# Patient Record
Sex: Female | Born: 1970 | Race: White | Hispanic: No | Marital: Single | Smoking: Never smoker
Health system: Southern US, Community
[De-identification: ages and names within clinical notes are randomized; demographics above are authoritative.]

## PROBLEM LIST (undated history)

## (undated) ENCOUNTER — Inpatient Hospital Stay (HOSPITAL_COMMUNITY): Payer: Self-pay

## (undated) DIAGNOSIS — Z9889 Other specified postprocedural states: Secondary | ICD-10-CM

## (undated) DIAGNOSIS — F419 Anxiety disorder, unspecified: Secondary | ICD-10-CM

## (undated) DIAGNOSIS — R7303 Prediabetes: Secondary | ICD-10-CM

## (undated) DIAGNOSIS — L309 Dermatitis, unspecified: Secondary | ICD-10-CM

## (undated) DIAGNOSIS — R112 Nausea with vomiting, unspecified: Secondary | ICD-10-CM

## (undated) DIAGNOSIS — T7840XA Allergy, unspecified, initial encounter: Secondary | ICD-10-CM

## (undated) DIAGNOSIS — E039 Hypothyroidism, unspecified: Secondary | ICD-10-CM

## (undated) HISTORY — PX: DILATION AND CURETTAGE OF UTERUS: SHX78

## (undated) HISTORY — DX: Anxiety disorder, unspecified: F41.9

## (undated) HISTORY — DX: Dermatitis, unspecified: L30.9

## (undated) HISTORY — PX: KNEE CARTILAGE SURGERY: SHX688

## (undated) HISTORY — PX: WISDOM TOOTH EXTRACTION: SHX21

## (undated) HISTORY — DX: Allergy, unspecified, initial encounter: T78.40XA

---

## 2011-03-22 HISTORY — PX: LAPAROSCOPY: SHX197

## 2016-01-05 ENCOUNTER — Encounter: Payer: Self-pay | Admitting: Pediatrics

## 2016-01-05 ENCOUNTER — Ambulatory Visit (INDEPENDENT_AMBULATORY_CARE_PROVIDER_SITE_OTHER): Payer: BLUE CROSS/BLUE SHIELD | Admitting: Pediatrics

## 2016-01-05 VITALS — BP 122/80 | HR 82 | Temp 98.0°F | Resp 18 | Ht 63.0 in | Wt 188.6 lb

## 2016-01-05 DIAGNOSIS — J0100 Acute maxillary sinusitis, unspecified: Secondary | ICD-10-CM

## 2016-01-05 DIAGNOSIS — J4530 Mild persistent asthma, uncomplicated: Secondary | ICD-10-CM | POA: Insufficient documentation

## 2016-01-05 DIAGNOSIS — J3089 Other allergic rhinitis: Secondary | ICD-10-CM | POA: Diagnosis not present

## 2016-01-05 MED ORDER — FLUTICASONE PROPIONATE 50 MCG/ACT NA SUSP: 2.0000 | Freq: Every day | NASAL | 5 refills | Status: DC

## 2016-01-05 MED ORDER — ALBUTEROL SULFATE HFA 108 (90 BASE) MCG/ACT IN AERS: 2.0000 | INHALATION_SPRAY | RESPIRATORY_TRACT | 3 refills | Status: DC | PRN

## 2016-01-05 MED ORDER — CETIRIZINE HCL 10 MG PO TABS: 10.0000 mg | ORAL_TABLET | Freq: Every day | ORAL | 5 refills | Status: DC

## 2016-01-05 MED ORDER — MONTELUKAST SODIUM 10 MG PO TABS: 10.0000 mg | ORAL_TABLET | Freq: Every day | ORAL | 5 refills | Status: DC

## 2016-01-05 MED ORDER — AMOXICILLIN-POT CLAVULANATE 875-125 MG PO TABS: 1.0000 | ORAL_TABLET | Freq: Two times a day (BID) | ORAL | 0 refills | Status: DC

## 2016-01-05 NOTE — Patient Instructions (Addendum)
Environmental control of dust and mold Zyrtec 10 mg once a day for runny nose Fluticasone 2 sprays per nostril once a day for stuffy nose Proair -2 puffs every 4 hours if needed for wheezing or coughing spells Montelukast  10 mg once a day for coughing or wheezing Add prednisone 20 mg twice a day for 3 days, 20 mg on the fourth day, 10 mg on the fifth day Augmentin 875 mg-take 1 tablet every 12 hours for 10 days for infection She  should have a flu vaccination Call me if you are not doing better on this treatment

## 2016-01-05 NOTE — Progress Notes (Signed)
[place] [id number] Dept: [phone]  New Patient Note  Patient ID: Tamara Bender, female    DOB: Feb 10, 1971  Age: 45 y.o. MRN: [id number] Date of Office Visit: 01/05/2016 Referring provider: Lilian Coma, MD [place] [id number] [place], [place] [address]    Chief Complaint: New Patient (Initial Visit) (for the last month pt has felt like her throat is closing. She has some coughing. )  HPI Tamara Bender presents for evaluation of nasal congestion and coughing spells for about 3 weeks. She has had nasal congestion since she moved from [place] into this area. She had a sinus infection in the end of September treated with Augmentin. She is bringing up a discolored mucus. Her symptoms are worse at night. She has a history of eczema as a child. She has aggravation of her nasal congestion on  exposure to dust and cigarette smoke. She does not have a history of asthma or hives. She continues to breast-feed.  Review of Systems  Constitutional: Negative.   HENT:       Nasal congestion off and on for about 5 months  Eyes: Negative.   Respiratory:       Coughing spells for 3 weeks  Cardiovascular: Negative.   Gastrointestinal: Negative.   Genitourinary:       History of PCOS  Musculoskeletal: Negative.   Skin:       History of eczema as a child  Neurological: Negative.   Endo/Heme/Allergies:       Pre-diabetes. Hypothyroidism since age 33 complicated by hair loss  Psychiatric/Behavioral: Negative.     Outpatient Encounter Prescriptions as of 01/05/2016  Medication Sig  . levothyroxine (SYNTHROID) 75 MCG tablet Take 75 mcg by mouth daily.  . Prenatal Vit-Fe Fumarate-FA (PRENATAL MULTIVITAMIN) TABS tablet Take 1 tablet by mouth daily at 12 noon.  Marland Kitchen albuterol (PROAIR HFA) 108 (90 Base) MCG/ACT inhaler Inhale 2 puffs into the lungs every 4 (four) hours as needed for wheezing or shortness of breath.  Marland Kitchen amoxicillin-clavulanate (AUGMENTIN) 875-125 MG tablet Take  1 tablet by mouth 2 (two) times daily.  . cetirizine (ZYRTEC) 10 MG tablet Take 1 tablet (10 mg total) by mouth daily.  . fluticasone (FLONASE) 50 MCG/ACT nasal spray Place 2 sprays into both nostrils daily.  . montelukast (SINGULAIR) 10 MG tablet Take 1 tablet (10 mg total) by mouth at bedtime.   No facility-administered encounter medications on file as of 01/05/2016.      Drug Allergies:  No Known Allergies  Family History: [name]'s family history is not on file..Family history is negative for hayfever, asthma, sinus problems eczema, hives, food allergies, chronic bronchitis or emphysema.  Social and environmental. She has a dog at home. She is not exposed to cigarette smoking. She has never smoked cigarettes.  Physical Exam: BP 122/80   Pulse 82   Temp 98 F (36.7 C) (Oral)   Resp 18   Ht 5\' 3"  (1.6 m)   Wt 188 lb 9.6 oz (85.5 kg)   SpO2 95%   BMI 33.41 kg/m    Physical Exam  Constitutional: She is oriented to person, place, and time. She appears well-developed and well-nourished.  HENT:  Eyes normal. Ears normal. Nose mild swelling of nasal turbinates. Pharynx normal except for a yellow postnasal drainage  Neck: Neck supple. No thyromegaly present.  Cardiovascular:  S1 and S2 normal no murmurs  Pulmonary/Chest:  Clear to percussion and auscultation  Abdominal: Soft. There is no tenderness (no hepatosplenomegaly).  Lymphadenopathy:    She has no cervical adenopathy.  Neurological: She is alert and oriented to person, place, and time.  Skin:  Clear but some hair loss noted in her scalp  Psychiatric: She has a normal mood and affect. Her behavior is normal. Judgment and thought content normal.  Vitals reviewed.   Diagnostics: FVC 2.15 L FEV1 1.89 L. Predicted FVC 3.55 L predicted FEV1 2.88 L. After albuterol 2 puffs FVC 2.26 L FEV1 2.12 L-this shows a moderate reduction in the forced vital capacity. The FEV1 did improve 12%  Allergy skin tests were positive to some  molds on intradermal testing only   Assessment  Assessment and Plan: 1. Mild persistent asthma without complication   2. Other allergic rhinitis   3. Acute non-recurrent maxillary sinusitis     Meds ordered this encounter  Medications  . cetirizine (ZYRTEC) 10 MG tablet    Sig: Take 1 tablet (10 mg total) by mouth daily.    Dispense:  30 tablet    Refill:  5  . fluticasone (FLONASE) 50 MCG/ACT nasal spray    Sig: Place 2 sprays into both nostrils daily.    Dispense:  16 g    Refill:  5  . amoxicillin-clavulanate (AUGMENTIN) 875-125 MG tablet    Sig: Take 1 tablet by mouth 2 (two) times daily.    Dispense:  20 tablet    Refill:  0  . albuterol (PROAIR HFA) 108 (90 Base) MCG/ACT inhaler    Sig: Inhale 2 puffs into the lungs every 4 (four) hours as needed for wheezing or shortness of breath.    Dispense:  1 Inhaler    Refill:  3  . montelukast (SINGULAIR) 10 MG tablet    Sig: Take 1 tablet (10 mg total) by mouth at bedtime.    Dispense:  30 tablet    Refill:  5    Patient Instructions  Environmental control of dust and mold Zyrtec 10 mg once a day for runny nose Fluticasone 2 sprays per nostril once a day for stuffy nose Proair -2 puffs every 4 hours if needed for wheezing or coughing spells Montelukast  10 mg once a day for coughing or wheezing Add prednisone 20 mg twice a day for 3 days, 20 mg on the fourth day, 10 mg on the fifth day Augmentin 875 mg-take 1 tablet every 12 hours for 10 days for infection She  should have a flu vaccination Call me if you are not doing better on this treatment    Return in about 4 weeks (around 02/02/2016).   Thank you for the opportunity to care for this patient.  Please do not hesitate to contact me with questions.  Penne Lash, M.D.  [facility] [address] [place], [place] [address] [phone]

## 2016-02-08 ENCOUNTER — Ambulatory Visit: Payer: BLUE CROSS/BLUE SHIELD | Admitting: Pediatrics

## 2016-02-09 ENCOUNTER — Ambulatory Visit: Payer: BLUE CROSS/BLUE SHIELD | Admitting: Pediatrics

## 2016-04-21 ENCOUNTER — Other Ambulatory Visit: Payer: Self-pay | Admitting: Sports Medicine

## 2016-04-21 ENCOUNTER — Other Ambulatory Visit: Payer: Self-pay

## 2016-04-21 DIAGNOSIS — M25562 Pain in left knee: Secondary | ICD-10-CM

## 2016-04-22 ENCOUNTER — Ambulatory Visit
Admission: RE | Admit: 2016-04-22 | Discharge: 2016-04-22 | Disposition: A | Discharge status: Home / Self Care | Payer: BLUE CROSS/BLUE SHIELD | Source: Ambulatory Visit | Attending: Sports Medicine | Admitting: Sports Medicine

## 2016-04-22 DIAGNOSIS — M25562 Pain in left knee: Secondary | ICD-10-CM

## 2017-02-28 ENCOUNTER — Telehealth: Payer: Self-pay | Admitting: Genetics

## 2017-02-28 NOTE — Telephone Encounter (Signed)
Ms. 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  Her paternal grandmother had breast cancer twice in her 39's.  Ms. Landgrebe scheduled an appointment 1/9 at 9:00 am to discuss genetic testing further.  She reports she had the [facility] panel done in 2015- she will fill out a release form so we can try to obtain these results to determine if she needs more genetic testing or not.

## 2017-03-24 ENCOUNTER — Telehealth: Payer: Self-pay | Admitting: Genetics

## 2017-03-24 NOTE — Telephone Encounter (Signed)
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  However, looking in other databases the some other labs are calling it benign.  I recommended her siblings still have genetic testing for the MUTYH mutation and given the paternal family history of breast cancer.  She reports that they said they would make appointments.  She gave permission to discuss her results and the family history she shared with me with her siblings Dona Walby and Luanna Cole.  I will send her a copy of the test results we obtained in the mail to her- verified her address.

## 2017-03-29 ENCOUNTER — Other Ambulatory Visit: Payer: BLUE CROSS/BLUE SHIELD

## 2017-03-29 ENCOUNTER — Encounter: Payer: BLUE CROSS/BLUE SHIELD | Admitting: Genetics

## 2017-04-15 ENCOUNTER — Inpatient Hospital Stay (HOSPITAL_COMMUNITY)
Admission: AD | Admit: 2017-04-15 | Discharge: 2017-04-16 | Disposition: A | Discharge status: Home / Self Care | Payer: BLUE CROSS/BLUE SHIELD | Source: Ambulatory Visit | Attending: Obstetrics & Gynecology | Admitting: Obstetrics & Gynecology

## 2017-04-15 ENCOUNTER — Inpatient Hospital Stay (HOSPITAL_COMMUNITY): Payer: BLUE CROSS/BLUE SHIELD

## 2017-04-15 ENCOUNTER — Encounter (HOSPITAL_COMMUNITY): Payer: Self-pay | Admitting: *Deleted

## 2017-04-15 DIAGNOSIS — O020 Blighted ovum and nonhydatidiform mole: Secondary | ICD-10-CM | POA: Insufficient documentation

## 2017-04-15 DIAGNOSIS — O209 Hemorrhage in early pregnancy, unspecified: Secondary | ICD-10-CM

## 2017-04-15 DIAGNOSIS — Z3A08 8 weeks gestation of pregnancy: Secondary | ICD-10-CM | POA: Diagnosis not present

## 2017-04-15 HISTORY — DX: Hypothyroidism, unspecified: E03.9

## 2017-04-15 LAB — CBC
HCT: 36.2 % (ref 36.0–46.0)
Hemoglobin: 12 g/dL (ref 12.0–15.0)
MCH: 28 pg (ref 26.0–34.0)
MCHC: 33.1 g/dL (ref 30.0–36.0)
MCV: 84.6 fL (ref 78.0–100.0)
PLATELETS: 326 10*3/uL (ref 150–400)
RBC: 4.28 MIL/uL (ref 3.87–5.11)
RDW: 12.8 % (ref 11.5–15.5)
WBC: 19.3 10*3/uL — AB (ref 4.0–10.5)

## 2017-04-15 MED ORDER — LACTATED RINGERS IV SOLN: INTRAVENOUS | Status: DC

## 2017-04-15 NOTE — MAU Provider Note (Signed)
History     CSN: [id number]  Arrival date and time: 04/15/17 2026   First Provider Initiated Contact with Patient 04/15/17 2116      Chief Complaint  Patient presents with  . Vaginal Bleeding   HPI Tamara Bender 47 y.o. [redacted]w[redacted]d  Client had IVF in December at [place] out on Apr 04, 2017 that she had a blighted ovum.  Stopped her meds and progesterone and was expecting a miscarriage.  Had light vaginal bleeding for one week but today began having cramping and much heavier bleeding.  Was using a cervical cup with the bleeding and it kept having to be changed.  She had so much bleeding, she finally sat in the bathtub.  She had a hand sized clot at home.  And then started being dizzy while sitting down.  Called her doctor at [facility] who advised calling EMS.  Her diastolic BP was in the 85'I when EMS arrived so she came to MAU.  At this point, the bleeding is less although still coming.  BP on arrival 121/63.  OB History    Gravida Para Term Preterm AB Living   8 2 2   5 2    SAB TAB Ectopic Multiple Live Births   5       2      Past Medical History:  Diagnosis Date  . Eczema   . Hypothyroid     Past Surgical History:  Procedure Laterality Date  . CESAREAN SECTION    . KNEE CARTILAGE SURGERY    . LAPAROSCOPY      Family History  Problem Relation Age of Onset  . Allergic rhinitis Neg Hx   . Angioedema Neg Hx   . Asthma Neg Hx   . Eczema Neg Hx   . Urticaria Neg Hx   . Immunodeficiency Neg Hx     Social History   Tobacco Use  . Smoking status: Never Smoker  . Smokeless tobacco: Never Used  Substance Use Topics  . Alcohol use: Yes  . Drug use: No    Allergies: No Known Allergies  Medications Prior to Admission  Medication Sig Dispense Refill Last Dose  . albuterol (PROAIR HFA) 108 (90 Base) MCG/ACT inhaler Inhale 2 puffs into the lungs every 4 (four) hours as needed for wheezing or shortness of breath. 1 Inhaler 3   . amoxicillin-clavulanate (AUGMENTIN)  875-125 MG tablet Take 1 tablet by mouth 2 (two) times daily. 20 tablet 0   . cetirizine (ZYRTEC) 10 MG tablet Take 1 tablet (10 mg total) by mouth daily. 30 tablet 5   . fluticasone (FLONASE) 50 MCG/ACT nasal spray Place 2 sprays into both nostrils daily. 16 g 5   . levothyroxine (SYNTHROID) 75 MCG tablet Take 75 mcg by mouth daily.   Taking  . montelukast (SINGULAIR) 10 MG tablet Take 1 tablet (10 mg total) by mouth at bedtime. 30 tablet 5   . Prenatal Vit-Fe Fumarate-FA (PRENATAL MULTIVITAMIN) TABS tablet Take 1 tablet by mouth daily at 12 noon.   Taking    Review of Systems  Constitutional: Negative for fever.  Eyes: Negative for visual disturbance.  Gastrointestinal: Positive for abdominal pain. Negative for diarrhea, nausea and vomiting.  Genitourinary: Positive for vaginal bleeding.  Neurological: Positive for dizziness. Negative for headaches.   Physical Exam   Blood pressure 121/63, pulse 78, temperature 98.2 F (36.8 C), resp. rate 18, height 5\' 4"  (1.626 m), weight 192 lb (87.1 kg), last menstrual period 02/15/2017.  Physical Exam  Nursing note and vitals reviewed. Constitutional: She is oriented to person, place, and time. She appears well-developed and well-nourished.  HENT:  Head: Normocephalic.  Eyes: EOM are normal.  Neck: Neck supple.  GI: Soft. There is no tenderness. There is no rebound and no guarding.  Genitourinary:  Genitourinary Comments: Speculum exam: Vagina - Several clots removed - half dollar sized.  Cervix - one clot in cervix, teased out with kelly clamp, then had small amoutn of dark blood oozing. Bimanual exam: deferred. Chaperone present for exam.   Musculoskeletal: Normal range of motion.  Neurological: She is alert and oriented to person, place, and time.  Skin: Skin is warm and dry.  Psychiatric: She has a normal mood and affect.    MAU Course  Procedures CLINICAL DATA:  Severe vaginal bleeding. Current assigned gestational age of [redacted]  weeks 3 days by IVF.  EXAM: OBSTETRIC <14 WK [place] AND TRANSVAGINAL OB US  TECHNIQUE: Both transabdominal and transvaginal ultrasound examinations were performed for complete evaluation of the gestation as well as the maternal uterus, adnexal regions, and pelvic cul-de-sac. Transvaginal technique was performed to assess early pregnancy.  COMPARISON:  None.  FINDINGS: Intrauterine gestational sac: Single; irregular sac shape  Yolk sac:  Not Visualized.  Embryo:  Not Visualized.  MSD: 14 mm   6 w   2 d  Subchorionic hemorrhage:  None visualized.  Maternal uterus/adnexae: Both ovaries are normal appearance. No adnexal mass or abnormal free fluid identified.  IMPRESSION: Findings are suspicious but not yet definitive for failed pregnancy. Recommend follow-up US in 10-14 days for definitive diagnosis. This recommendation follows SRU consensus guidelines: Diagnostic Criteria for Nonviable Pregnancy Early in the First Trimester. [place] Med 2013; 948:5462-70.   Results for orders placed or performed during the hospital encounter of 04/15/17 (from the past 24 hour(s))  CBC     Status: Abnormal   Collection Time: 04/15/17  9:12 PM  Result Value Ref Range   WBC 19.3 (H) 4.0 - 10.5 K/uL   RBC 4.28 3.87 - 5.11 MIL/uL   Hemoglobin 12.0 12.0 - 15.0 g/dL   HCT 36.2 36.0 - 46.0 %   MCV 84.6 78.0 - 100.0 fL   MCH 28.0 26.0 - 34.0 pg   MCHC 33.1 30.0 - 36.0 g/dL   RDW 12.8 11.5 - 15.5 %   Platelets 326 150 - 400 K/uL  hCG, quantitative, pregnancy     Status: Abnormal   Collection Time: 04/15/17  9:12 PM  Result Value Ref Range   hCG, Beta Chain, Quant, S 18,769 (H) <5 mIU/mL  ABO/Rh     Status: None (Preliminary result)   Collection Time: 04/15/17  9:12 PM  Result Value Ref Range   ABO/RH(D) O POS     MDM Client has had IVF previously.  States her blood type is O positive.  Client very sensitive to the medical charges.  Her insurance required a $7000 out of pocket  per year.  Does not want extra tests done.    Consult with Dr. Harolyn Rutherford.  Will give IVF as orthostatic/tachcardiac on standing and will draw ABO Rh and quant to have on record.  Can be scheduled as OR is available on Monday if she wants D&E.  HGB is stable despite the heavy bleeding she had earlier today and Ultrasound today shows sac is still present in uterus.  Care assumed by Robyne Askew, NP at 2325.  IVF infusing.    O Positive blood type  confirmed  Discussed options for management of incomplete AB including expectant management, Cytotec or D&C. Prefers expectant management at this time. Verbalizes understanding that intervention may become necessary if SAB is not completed spontaneously or if heavy bleeding or infection occur.   Cytotec, percocet, & zofran given to patient prior to discharge.      Early Intrauterine Pregnancy Failure Protocol X  Documented intrauterine pregnancy failure less than or equal to [redacted] weeks gestation  X  No serious current illness  X  Baseline Hgb greater than or equal to 10g/dl  X  Patient has easy accessible transportation to the hospital  X  Clear preference  X  Practitioner/physician deems patient reliable  X  Counseling by practitioner or physician  X  Patient education by RN  X  Consent form signed       Rho-Gam given by RN if indicated  X  Medication dispensed  X  Cytotec 600 mcg buccally by RN in MAU       Intravaginally by NP in MAU       Rectally by patient at home       Rectally by RN in MAU   X   Percocet 1 tab Q4 hrs prn, #15 X   Zofran 4 mg ODT Q 8 hrs prn  Reviewed with pt cytotec procedure.  Pt verbalizes that she lives close to the hospital and has transportation readily available.  Pt appears reliable and verbalizes understanding and agrees with plan of care  Assessment and Plan  A: 1. Blighted ovum   2. Vaginal bleeding in pregnancy, first trimester    P: Discharge home Discussed reasons to return to MAU Rx percocet & zofran  prn F/u with reproductive endocrinologist   Jorje Guild, NP

## 2017-04-15 NOTE — MAU Note (Signed)
Pt presents via EMS for heavy vaginal bleeding that started today around 2 today. PT states that she had IVF and was told by U/S she had a blighted ovum.

## 2017-04-16 ENCOUNTER — Other Ambulatory Visit: Payer: Self-pay | Admitting: Advanced Practice Midwife

## 2017-04-16 DIAGNOSIS — O020 Blighted ovum and nonhydatidiform mole: Secondary | ICD-10-CM

## 2017-04-16 LAB — HCG, QUANTITATIVE, PREGNANCY: hCG, Beta Chain, Quant, S: 18769 m[IU]/mL — ABNORMAL HIGH (ref <5)

## 2017-04-16 LAB — ABO/RH: ABO/RH(D): O POS

## 2017-04-16 MED ORDER — OXYCODONE-ACETAMINOPHEN 5-325 MG PO TABS: 1.0000 | ORAL_TABLET | ORAL | 0 refills | Status: DC | PRN

## 2017-04-16 MED ORDER — OXYCODONE-ACETAMINOPHEN 5-325 MG PO TABS
1.0000 | ORAL_TABLET | Freq: Once | ORAL | Status: AC
  Administered 2017-04-16: 1 via ORAL
  Filled 2017-04-16: qty 1

## 2017-04-16 MED ORDER — ONDANSETRON 4 MG PO TBDP: 4.0000 mg | ORAL_TABLET | Freq: Three times a day (TID) | ORAL | 0 refills | Status: DC | PRN

## 2017-04-16 MED ORDER — MISOPROSTOL 200 MCG PO TABS: 800.0000 ug | ORAL_TABLET | Freq: Once | ORAL | 0 refills | Status: DC

## 2017-04-16 MED ORDER — ONDANSETRON HCL 4 MG/2ML IJ SOLN
4.0000 mg | Freq: Once | INTRAMUSCULAR | Status: AC
  Administered 2017-04-16: 4 mg via INTRAVENOUS
  Filled 2017-04-16: qty 2

## 2017-04-16 MED ORDER — MISOPROSTOL 200 MCG PO TABS
600.0000 ug | ORAL_TABLET | Freq: Once | ORAL | Status: AC
  Administered 2017-04-16: 600 ug via BUCCAL
  Filled 2017-04-16: qty 3

## 2017-04-16 NOTE — Progress Notes (Addendum)
Patient called MAU, and states that she has not had any bleeding since having the Cytotec here last night. She has had minor cramps and spotting only at this time. She is wondering if she needs have another dose of Cytotec or if she can have D&C. Thoroughly discussed each option with the patient. She reports that in the past she has had D&Cs for a miscarriage, and she is more interested in having a D&C at this time. DW Dr. Kennon Rounds, ok to send a message to [place] to schedule a D&C. Patient will try a second dose of Cytotec tomorrow as well. Cytotec 814mcg buccally x1 sent to pharmacy on file.  Marcille Buffy 8:02 PM 04/16/17

## 2017-04-16 NOTE — Discharge Instructions (Signed)
FACTS YOU SHOULD KNOW  WHAT IS AN EARLY PREGNANCY FAILURE? Once the egg is fertilized with the sperm and begins to develop, it attaches to the lining of the uterus. This early pregnancy tissue may not develop into an embryo (the beginning stage of a baby). Sometimes an embryo does develop but does not continue to grow. These problems can be seen on ultrasound.   MANAGEMNT OF EARLY PREGNANCY FAILURE: About 4 out of 100 (0.25%) women will have a pregnancy loss in her lifetime.  One in five pregnancies is found to be an early pregnancy failure.  There are 3 ways to care for an early pregnancy failure:   (1) Surgery, (2) Medicine, (3) Waiting for you to pass the pregnancy on your own. The decision as to how to proceed after being diagnosed with and early pregnancy failure is an individual one.  The decision can be made only after appropriate counseling.  You need to weigh the pros and cons of the 3 choices. Then you can make the choice that works for you. SURGERY (D&E)  Procedure over in 1 day  Requires being put to sleep  Bleeding may be light  Possible problems during surgery, including injury to womb(uterus)  Care provider has more control Medicine (CYTOTEC)  The complete procedure may take days to weeks  No Surgery  Bleeding may be heavy at times  There may be drug side effects  Patient has more control Waiting  You may choose to wait, in which case your own body may complete the passing of the abnormal early pregnancy on its own in about 2-4 weeks  Your bleeding may be heavy at times  There is a small possibility that you may need surgery if the bleeding is too much or not all of the pregnancy has passed. CYTOTEC MANAGEMENT Prostaglandins (cytotec) are the most widely used drug for this purpose. They cause the uterus to cramp and contract. You will place the medicine yourself inside your vagina in the privacy of your home. Empting of the uterus should occur within 3 days but  the process may continue for several weeks. The bleeding may seem heavy at times. POSSIBLE SIDE EFFECTS FROM CYTOTEC  Nausea   Vomiting  Diarrhea Fever  Chills  Hot Flashes Side effects  from the process of the early pregnancy failure include:  Cramping  Bleeding  Headaches  Dizziness RISKS: This is a low risk procedure. Less than 1 in 100 women has a complication. An incomplete passage of the early pregnancy may occur. Also, Hemorrhage (heavy bleeding) could happen.  Rarely the pregnancy will not be passed completely. Excessively heavy bleeding may occur.  Your doctor may need to perform surgery to empty the uterus (D&E). Afterwards: Everybody will feel differently after the early pregnancy completion. You may have soreness or cramps for a day or two. You may have soreness or cramps for day or two.  You may have light bleeding for up to 2 weeks. You may be as active as you feel like being. If you have any of the following problems you may call Maternity Admissions Unit at [phone].  If you have pain that does not get better  with pain medication  Bleeding that soaks through 2 thick full-sized sanitary pads in an hour for more than 2 consecutive hours  Cramps that last longer than 2 days  Foul smelling discharge  Fever above 100.4 degrees F Even if you do not have any of these symptoms, you should have a  follow-up exam to make sure you are healing properly. This appointment will be made for you before you leave the hospital. Your next normal period will start again in 4-6 week after the loss. You can get pregnant soon after the loss, so use birth control right away. Finally: Make sure all your questions are answered before during and after any procedure. Follow up with medical care and family planning methods.

## 2017-04-17 ENCOUNTER — Encounter (HOSPITAL_COMMUNITY): Payer: Self-pay | Admitting: *Deleted

## 2017-04-17 ENCOUNTER — Other Ambulatory Visit: Payer: Self-pay

## 2017-04-18 ENCOUNTER — Encounter (HOSPITAL_COMMUNITY): Payer: Self-pay | Admitting: Emergency Medicine

## 2017-04-18 ENCOUNTER — Other Ambulatory Visit: Payer: Self-pay

## 2017-04-18 ENCOUNTER — Encounter (HOSPITAL_COMMUNITY)
Admission: RE | Disposition: A | Discharge status: Home / Self Care | Payer: Self-pay | Source: Ambulatory Visit | Attending: Obstetrics and Gynecology

## 2017-04-18 ENCOUNTER — Ambulatory Visit (HOSPITAL_COMMUNITY): Payer: BLUE CROSS/BLUE SHIELD | Admitting: Certified Registered Nurse Anesthetist

## 2017-04-18 ENCOUNTER — Ambulatory Visit (HOSPITAL_COMMUNITY)
Admission: RE | Admit: 2017-04-18 | Discharge: 2017-04-18 | Disposition: A | Discharge status: Home / Self Care | Payer: BLUE CROSS/BLUE SHIELD | Source: Ambulatory Visit | Attending: Obstetrics and Gynecology | Admitting: Obstetrics and Gynecology

## 2017-04-18 DIAGNOSIS — R7303 Prediabetes: Secondary | ICD-10-CM | POA: Insufficient documentation

## 2017-04-18 DIAGNOSIS — Z79899 Other long term (current) drug therapy: Secondary | ICD-10-CM | POA: Insufficient documentation

## 2017-04-18 DIAGNOSIS — E039 Hypothyroidism, unspecified: Secondary | ICD-10-CM | POA: Insufficient documentation

## 2017-04-18 DIAGNOSIS — O021 Missed abortion: Secondary | ICD-10-CM

## 2017-04-18 HISTORY — DX: Prediabetes: R73.03

## 2017-04-18 HISTORY — PX: DILATION AND EVACUATION: SHX1459

## 2017-04-18 HISTORY — DX: Other specified postprocedural states: Z98.890

## 2017-04-18 HISTORY — DX: Other specified postprocedural states: R11.2

## 2017-04-18 LAB — CBC
HEMATOCRIT: 31.4 % — AB (ref 36.0–46.0)
Hemoglobin: 10.3 g/dL — ABNORMAL LOW (ref 12.0–15.0)
MCH: 27.8 pg (ref 26.0–34.0)
MCHC: 32.8 g/dL (ref 30.0–36.0)
MCV: 84.9 fL (ref 78.0–100.0)
PLATELETS: 287 10*3/uL (ref 150–400)
RBC: 3.7 MIL/uL — ABNORMAL LOW (ref 3.87–5.11)
RDW: 13 % (ref 11.5–15.5)
WBC: 7 10*3/uL (ref 4.0–10.5)

## 2017-04-18 LAB — HCG, QUANTITATIVE, PREGNANCY: HCG, BETA CHAIN, QUANT, S: 2941 m[IU]/mL — AB (ref <5)

## 2017-04-18 SURGERY — DILATION AND EVACUATION, UTERUS
Anesthesia: Monitor Anesthesia Care | Site: Vagina

## 2017-04-18 MED ORDER — MIDAZOLAM HCL 2 MG/2ML IJ SOLN
INTRAMUSCULAR | Status: AC
  Filled 2017-04-18: qty 2

## 2017-04-18 MED ORDER — FENTANYL CITRATE (PF) 100 MCG/2ML IJ SOLN: 25.0000 ug | INTRAMUSCULAR | Status: DC | PRN

## 2017-04-18 MED ORDER — LIDOCAINE HCL (CARDIAC) 20 MG/ML IV SOLN
INTRAVENOUS | Status: AC
  Filled 2017-04-18: qty 5

## 2017-04-18 MED ORDER — ONDANSETRON HCL 4 MG/2ML IJ SOLN
INTRAMUSCULAR | Status: DC | PRN
  Administered 2017-04-18: 4 mg via INTRAVENOUS

## 2017-04-18 MED ORDER — DEXAMETHASONE SODIUM PHOSPHATE 10 MG/ML IJ SOLN
INTRAMUSCULAR | Status: DC | PRN
  Administered 2017-04-18: 4 mg via INTRAVENOUS

## 2017-04-18 MED ORDER — METOCLOPRAMIDE HCL 5 MG/ML IJ SOLN: 10.0000 mg | Freq: Once | INTRAMUSCULAR | Status: DC | PRN

## 2017-04-18 MED ORDER — PROPOFOL 10 MG/ML IV BOLUS
INTRAVENOUS | Status: DC | PRN
  Administered 2017-04-18: 30 mg via INTRAVENOUS
  Administered 2017-04-18 (×2): 20 mg via INTRAVENOUS
  Administered 2017-04-18: 30 mg via INTRAVENOUS
  Administered 2017-04-18: 20 mg via INTRAVENOUS
  Administered 2017-04-18: 30 mg via INTRAVENOUS
  Administered 2017-04-18: 20 mg via INTRAVENOUS
  Administered 2017-04-18: 30 mg via INTRAVENOUS
  Administered 2017-04-18 (×2): 20 mg via INTRAVENOUS
  Administered 2017-04-18: 30 mg via INTRAVENOUS
  Administered 2017-04-18: 20 mg via INTRAVENOUS

## 2017-04-18 MED ORDER — HYDROCODONE-ACETAMINOPHEN 7.5-325 MG PO TABS: 1.0000 | ORAL_TABLET | Freq: Once | ORAL | Status: DC | PRN

## 2017-04-18 MED ORDER — CHLOROPROCAINE HCL 1 % IJ SOLN
INTRAMUSCULAR | Status: DC | PRN
  Administered 2017-04-18: 10 mL

## 2017-04-18 MED ORDER — KETOROLAC TROMETHAMINE 30 MG/ML IJ SOLN
INTRAMUSCULAR | Status: DC | PRN
  Administered 2017-04-18: 30 mg via INTRAVENOUS

## 2017-04-18 MED ORDER — SCOPOLAMINE 1 MG/3DAYS TD PT72
MEDICATED_PATCH | TRANSDERMAL | Status: AC
  Administered 2017-04-18: 1.5 mg via TRANSDERMAL
  Filled 2017-04-18: qty 1

## 2017-04-18 MED ORDER — IBUPROFEN 600 MG PO TABS: 600.0000 mg | ORAL_TABLET | Freq: Four times a day (QID) | ORAL | 3 refills | Status: DC | PRN

## 2017-04-18 MED ORDER — KETOROLAC TROMETHAMINE 30 MG/ML IJ SOLN
INTRAMUSCULAR | Status: AC
  Filled 2017-04-18: qty 1

## 2017-04-18 MED ORDER — DEXAMETHASONE SODIUM PHOSPHATE 4 MG/ML IJ SOLN
INTRAMUSCULAR | Status: AC
  Filled 2017-04-18: qty 1

## 2017-04-18 MED ORDER — LACTATED RINGERS IV SOLN
INTRAVENOUS | Status: DC
  Administered 2017-04-18: 13:00:00 via INTRAVENOUS

## 2017-04-18 MED ORDER — MEPERIDINE HCL 25 MG/ML IJ SOLN: 6.2500 mg | INTRAMUSCULAR | Status: DC | PRN

## 2017-04-18 MED ORDER — SCOPOLAMINE 1 MG/3DAYS TD PT72
1.0000 | MEDICATED_PATCH | Freq: Once | TRANSDERMAL | Status: DC
  Administered 2017-04-18: 1.5 mg via TRANSDERMAL

## 2017-04-18 MED ORDER — ONDANSETRON HCL 4 MG/2ML IJ SOLN
INTRAMUSCULAR | Status: AC
  Filled 2017-04-18: qty 2

## 2017-04-18 MED ORDER — FENTANYL CITRATE (PF) 100 MCG/2ML IJ SOLN
INTRAMUSCULAR | Status: DC | PRN
  Administered 2017-04-18 (×4): 25 ug via INTRAVENOUS

## 2017-04-18 MED ORDER — MIDAZOLAM HCL 2 MG/2ML IJ SOLN
INTRAMUSCULAR | Status: DC | PRN
  Administered 2017-04-18: 2 mg via INTRAVENOUS

## 2017-04-18 MED ORDER — FENTANYL CITRATE (PF) 100 MCG/2ML IJ SOLN
INTRAMUSCULAR | Status: AC
  Filled 2017-04-18: qty 2

## 2017-04-18 MED ORDER — CHLOROPROCAINE HCL 1 % IJ SOLN
INTRAMUSCULAR | Status: AC
  Filled 2017-04-18: qty 30

## 2017-04-18 MED ORDER — PROPOFOL 10 MG/ML IV BOLUS
INTRAVENOUS | Status: AC
  Filled 2017-04-18: qty 20

## 2017-04-18 SURGICAL SUPPLY — 19 items
CATH ROBINSON RED A/P 16FR (CATHETERS) ×3 IMPLANT
DECANTER SPIKE VIAL GLASS SM (MISCELLANEOUS) ×3 IMPLANT
GLOVE BIOGEL PI IND STRL 6.5 (GLOVE) ×1 IMPLANT
GLOVE BIOGEL PI IND STRL 7.0 (GLOVE) ×1 IMPLANT
GLOVE BIOGEL PI INDICATOR 6.5 (GLOVE) ×2
GLOVE BIOGEL PI INDICATOR 7.0 (GLOVE) ×2
GLOVE SURG SS PI 6.0 STRL IVOR (GLOVE) ×3 IMPLANT
GOWN STRL REUS W/TWL LRG LVL3 (GOWN DISPOSABLE) ×6 IMPLANT
KIT BERKELEY 1ST TRIMESTER 3/8 (MISCELLANEOUS) ×3 IMPLANT
NS IRRIG 1000ML POUR BTL (IV SOLUTION) ×3 IMPLANT
PACK VAGINAL MINOR WOMEN LF (CUSTOM PROCEDURE TRAY) ×3 IMPLANT
PAD OB MATERNITY 4.3X12.25 (PERSONAL CARE ITEMS) ×3 IMPLANT
PAD PREP 24X48 CUFFED NSTRL (MISCELLANEOUS) ×3 IMPLANT
SET BERKELEY SUCTION TUBING (SUCTIONS) ×3 IMPLANT
TOWEL OR 17X24 6PK STRL BLUE (TOWEL DISPOSABLE) ×6 IMPLANT
VACURETTE 10 RIGID CVD (CANNULA) IMPLANT
VACURETTE 7MM CVD STRL WRAP (CANNULA) ×3 IMPLANT
VACURETTE 8 RIGID CVD (CANNULA) IMPLANT
VACURETTE 9 RIGID CVD (CANNULA) IMPLANT

## 2017-04-18 NOTE — H&P (Signed)
Tamara Bender is an 47 y.o. female [phone] with a missed abortion here for definitive treatment with dilatation and evacuation. Patient was diagnosed with a failed pregnancy on 04/04/2017 with WF (pregnancy conceived via IVF). Patient was seen in [facility]U with cramping pain and vaginal bleeding on 04/15/2017. Ultrasound at that time demonstrated a persistent gestational sac. She opted for medical management with cytotec. On 04/16/2017, patient contacted office and reported no vaginal bleeding since taking cytotec. She elected to be scheduled for a dilatation and evacuation. She also elected to repeat medical treatment with cytotec. Patient reports minimal vaginal bleeding with second course of cytotec and did not experience any cramping   Menstrual History: Patient's last menstrual period was 02/15/2017.    Past Medical History:  Diagnosis Date  . Eczema    as a child, no current problems  . Hypothyroid   . PONV (postoperative nausea and vomiting)   . Pre-diabetes    no meds - diet controlled    Past Surgical History:  Procedure Laterality Date  . CESAREAN SECTION  2012, 2016   x 2  . DILATION AND CURETTAGE OF UTERUS     x 2 - MAB  . KNEE CARTILAGE SURGERY Bilateral 1998, 2018   1998 left, 2018 right  . LAPAROSCOPY  2013   blocked fallopian tube  . WISDOM TOOTH EXTRACTION      Family History  Problem Relation Age of Onset  . Allergic rhinitis Neg Hx   . Angioedema Neg Hx   . Asthma Neg Hx   . Eczema Neg Hx   . Urticaria Neg Hx   . Immunodeficiency Neg Hx     Social History:  reports that  has never smoked. she has never used smokeless tobacco. She reports that she drinks alcohol. She reports that she does not use drugs.  Allergies:  Allergies  Allergen Reactions  . Molds & Smuts Other (See Comments)    Airborne Molds causes SNEEZING    Medications Prior to Admission  Medication Sig Dispense Refill Last Dose  . albuterol (PROAIR HFA) 108 (90 Base) MCG/ACT inhaler Inhale 2  puffs into the lungs every 4 (four) hours as needed for wheezing or shortness of breath. 1 Inhaler 3   . levothyroxine (SYNTHROID, LEVOTHROID) 88 MCG tablet Take 88 mcg by mouth daily.  0 04/18/2017 at Unknown time  . misoprostol (CYTOTEC) 200 MCG tablet Take 4 tablets (800 mcg total) by mouth once for 1 dose. 3 tablet 0 04/17/2017 at Unknown time  . naproxen sodium (ALEVE) 220 MG tablet Take 220 mg by mouth as needed.   04/18/2017 at Unknown time  . ondansetron (ZOFRAN ODT) 4 MG disintegrating tablet Take 1 tablet (4 mg total) by mouth every 8 (eight) hours as needed for nausea or vomiting. 15 tablet 0 04/17/2017 at Unknown time  . oxyCODONE-acetaminophen (PERCOCET/ROXICET) 5-325 MG tablet Take 1 tablet by mouth every 4 (four) hours as needed. (Patient taking differently: Take 1 tablet by mouth every 4 (four) hours as needed for moderate pain. ) 15 tablet 0 04/17/2017 at Unknown time    ROS See pertinent in HPI Blood pressure 137/76, pulse 75, temperature 97.9 F (36.6 C), temperature source Oral, resp. rate 16, height 5\' 4"  (1.626 m), weight 192 lb (87.1 kg), last menstrual period 02/15/2017, SpO2 100 %. Physical Exam GENERAL: Well-developed, well-nourished female in no acute distress.  LUNGS: Clear to auscultation bilaterally.  HEART: Regular rate and rhythm. ABDOMEN: Soft, nontender, nondistended. No organomegaly. PELVIC: Deferred to OR EXTREMITIES:  No cyanosis, clubbing, or edema, 2+ distal pulses.  Results for orders placed or performed during the hospital encounter of 04/18/17 (from the past 24 hour(s))  CBC     Status: Abnormal   Collection Time: 04/18/17 12:08 PM  Result Value Ref Range   WBC 7.0 4.0 - 10.5 K/uL   RBC 3.70 (L) 3.87 - 5.11 MIL/uL   Hemoglobin 10.3 (L) 12.0 - 15.0 g/dL   HCT 31.4 (L) 36.0 - 46.0 %   MCV 84.9 78.0 - 100.0 fL   MCH 27.8 26.0 - 34.0 pg   MCHC 32.8 30.0 - 36.0 g/dL   RDW 13.0 11.5 - 15.5 %   Platelets 287 150 - 400 K/uL    No results  found. 04/15/2017 CLINICAL DATA: Severe vaginal bleeding. Current assigned gestational age of [redacted] weeks 3 days by IVF.  EXAM: OBSTETRIC <14 WK [place] AND TRANSVAGINAL OB US  TECHNIQUE: Both transabdominal and transvaginal ultrasound examinations were performed for complete evaluation of the gestation as well as the maternal uterus, adnexal regions, and pelvic cul-de-sac. Transvaginal technique was performed to assess early pregnancy.  COMPARISON: None.  FINDINGS: Intrauterine gestational sac: Single; irregular sac shape  Yolk sac: Not Visualized.  Embryo: Not Visualized.  MSD: 14 mm 6 w 2 d  Subchorionic hemorrhage: None visualized.  Maternal uterus/adnexae: Both ovaries are normal appearance. No adnexal mass or abnormal free fluid identified.  IMPRESSION: Findings are suspicious but not yet definitive for failed pregnancy. Recommend follow-up US in 10-14 days for definitive diagnosis. This recommendation follows SRU consensus guidelines: Diagnostic Criteria for Nonviable Pregnancy Early in the First Trimester. [employer] Med 2013; 119:4174-08.   Assessment/Plan: 47 yo [id number] with missed abortion here for definitive treatment with dilatation and evacuation - Risks, benefits and alternatives were explained including but not limited to risks of bleeding, infection, uterine perforation and damage to adjacent organs. Patient verbalized understanding and all questions were answered  Tamara Bender 04/18/2017, 12:40 PM

## 2017-04-18 NOTE — Transfer of Care (Signed)
Immediate Anesthesia Transfer of Care Note  Patient: Tamara Bender  Procedure(s) Performed: DILATATION AND EVACUATION (N/A Vagina )  Patient Location: PACU  Anesthesia Type:MAC  Level of Consciousness: awake, alert , oriented and patient cooperative  Airway & Oxygen Therapy: Patient Spontanous Breathing and Patient connected to nasal cannula oxygen  Post-op Assessment: Report given to RN, Post -op Vital signs reviewed and stable and Patient moving all extremities X 4  Post vital signs: Reviewed and stable  Last Vitals:  Vitals:   04/18/17 1210  BP: 137/76  Pulse: 75  Resp: 16  Temp: 36.6 C  SpO2: 100%    Last Pain:  Vitals:   04/18/17 1210  TempSrc: Oral      Patients Stated Pain Goal: 2 (68/15/94 7076)  Complications: No apparent anesthesia complications

## 2017-04-18 NOTE — Anesthesia Preprocedure Evaluation (Signed)
Anesthesia Evaluation  Patient identified by MRN, date of birth, ID band Patient awake    Reviewed: Allergy & Precautions, Patient's Chart, lab work & pertinent test results  History of Anesthesia Complications (+) PONV and history of anesthetic complications  Airway Mallampati: II  TM Distance: >3 FB Neck ROM: Full    Dental no notable dental hx. (+) Teeth Intact   Pulmonary asthma ,    Pulmonary exam normal breath sounds clear to auscultation       Cardiovascular negative cardio ROS Normal cardiovascular exam Rhythm:Regular Rate:Normal     Neuro/Psych negative neurological ROS  negative psych ROS   GI/Hepatic negative GI ROS, Neg liver ROS,   Endo/Other  Hypothyroidism Obesity Pre Diabetes  Renal/GU negative Renal ROS  negative genitourinary   Musculoskeletal negative musculoskeletal ROS (+)   Abdominal (+) + obese,   Peds  Hematology negative hematology ROS (+)   Anesthesia Other Findings   Reproductive/Obstetrics (+) Pregnancy Missed Ab                             Anesthesia Physical Anesthesia Plan  ASA: II  Anesthesia Plan: MAC   Post-op Pain Management:    Induction: Intravenous  PONV Risk Score and Plan:   Airway Management Planned: Natural Airway and Simple Face Mask  Additional Equipment:   Intra-op Plan:   Post-operative Plan:   Informed Consent: I have reviewed the patients History and Physical, chart, labs and discussed the procedure including the risks, benefits and alternatives for the proposed anesthesia with the patient or authorized representative who has indicated his/her understanding and acceptance.   Dental advisory given  Plan Discussed with: CRNA, Anesthesiologist and Surgeon  Anesthesia Plan Comments:         Anesthesia Quick Evaluation

## 2017-04-18 NOTE — Op Note (Signed)
Tamara Bender PROCEDURE DATE: 04/18/2017  PREOPERATIVE DIAGNOSIS: 6 week missed abortion. POSTOPERATIVE DIAGNOSIS: The same. PROCEDURE:     Dilation and Evacuation. SURGEON:  Dr. Mora Bellman  INDICATIONS: 47 y.o. [id number] MAB at [redacted] weeks gestation, needing surgical completion after 2 failed attempts of medical management with cytotec.  Risks of surgery were discussed with the patient including but not limited to: bleeding which may require transfusion; infection which may require antibiotics; injury to uterus or surrounding organs;need for additional procedures including laparotomy or laparoscopy; possibility of intrauterine scarring which may impair future fertility; and other postoperative/anesthesia complications. Written informed consent was obtained.    FINDINGS:  An 8-week size anteverted uterus, moderate amounts of products of conception, specimen sent to pathology.   ANESTHESIA:    Monitored intravenous sedation, paracervical block. INTRAVENOUS FLUIDS:  500 ml of LR ESTIMATED BLOOD LOSS:  Less than 20 ml. SPECIMENS:  Products of conception sent to pathology COMPLICATIONS:  None immediate.  PROCEDURE DETAILS:  The patient was then taken to the operating room where general anesthesia was administered and was found to be adequate.  After an adequate timeout was performed, she was placed in the dorsal lithotomy position and examined; then prepped and draped in the sterile manner.   Her bladder was catheterized for an unmeasured amount of clear, yellow urine. A vaginal speculum was then placed in the patient's vagina and a single tooth tenaculum was applied to the anterior lip of the cervix.  A paracervical block using 0.5% Marcaine was administered. The cervix was gently dilated to accommodate a 7 mm suction curette that was gently advanced to the uterine fundus.  The suction device was then activated and curette slowly rotated to clear the uterus of products of conception.  A sharp  curettage was then performed to confirm complete emptying of the uterus. There was minimal bleeding noted and the tenaculum removed with good hemostasis noted.   All instruments were removed from the patient's vagina. A post procedure bedside ultrasound revealed a thin endometrial stripe. The patient tolerated the procedure well and was taken to the recovery area awake, and in stable condition.  The patient will be discharged to home as per PACU criteria.  Routine postoperative instructions given.  She was prescribed Ibuprofen and Colace.  She will follow up in the office in  2 weeks for postoperative evaluation.

## 2017-04-18 NOTE — Discharge Instructions (Signed)
DISCHARGE INSTRUCTIONS: D&C / D&E The following instructions have been prepared to help you care for yourself upon your return home.   Personal hygiene:  Use sanitary pads for vaginal drainage, not tampons.  Shower the day after your procedure.  NO tub baths, pools or Jacuzzis for 2-3 weeks.  Wipe front to back after using the bathroom.  Activity and limitations:  Do NOT drive or operate any equipment for 24 hours. The effects of anesthesia are still present and drowsiness may result.  Do NOT rest in bed all day.  Walking is encouraged.  Walk up and down stairs slowly.  You may resume your normal activity in one to two days or as indicated by your physician.  Sexual activity: NO intercourse for at least 2 weeks after the procedure, or as indicated by your physician.  Diet: Eat a light meal as desired this evening. You may resume your usual diet tomorrow.  Return to work: You may resume your work activities in one to two days or as indicated by your doctor.  What to expect after your surgery: Expect to have vaginal bleeding/discharge for 2-3 days and spotting for up to 10 days. It is not unusual to have soreness for up to 1-2 weeks. You may have a slight burning sensation when you urinate for the first day. Mild cramps may continue for a couple of days. You may have a regular period in 2-6 weeks.  Call your doctor for any of the following:  Excessive vaginal bleeding, saturating and changing one pad every hour.  Inability to urinate 6 hours after discharge from hospital.  Pain not relieved by pain medication.  Fever of 100.4 F or greater.  Unusual vaginal discharge or odor.   Call for an appointment:

## 2017-04-18 NOTE — Anesthesia Postprocedure Evaluation (Signed)
Anesthesia Post Note  Patient: Tamara Bender  Procedure(s) Performed: DILATATION AND EVACUATION (N/A Vagina )     Patient location during evaluation: PACU Anesthesia Type: MAC Level of consciousness: awake and alert and oriented Pain management: pain level controlled Vital Signs Assessment: post-procedure vital signs reviewed and stable Respiratory status: spontaneous breathing, nonlabored ventilation and respiratory function stable Cardiovascular status: stable and blood pressure returned to baseline Postop Assessment: no apparent nausea or vomiting Anesthetic complications: no    Last Vitals:  Vitals:   04/18/17 1415 04/18/17 1430  BP: 122/70 121/66  Pulse: 61 (!) 57  Resp: 16 14  Temp:    SpO2: 100% 100%    Last Pain:  Vitals:   04/18/17 1210  TempSrc: Oral   Pain Goal: Patients Stated Pain Goal: 2 (04/18/17 1415)               Zakyia Gagan A.

## 2017-04-19 ENCOUNTER — Encounter (HOSPITAL_COMMUNITY): Payer: Self-pay | Admitting: Obstetrics and Gynecology

## 2017-05-08 ENCOUNTER — Encounter: Payer: Self-pay | Admitting: Obstetrics and Gynecology

## 2017-05-08 ENCOUNTER — Encounter: Payer: BLUE CROSS/BLUE SHIELD | Admitting: Obstetrics and Gynecology

## 2017-05-08 ENCOUNTER — Ambulatory Visit (INDEPENDENT_AMBULATORY_CARE_PROVIDER_SITE_OTHER): Payer: BLUE CROSS/BLUE SHIELD | Admitting: Obstetrics and Gynecology

## 2017-05-08 VITALS — BP 120/84 | HR 77 | Temp 97.4°F | Wt 197.0 lb

## 2017-05-08 DIAGNOSIS — Z9889 Other specified postprocedural states: Secondary | ICD-10-CM

## 2017-05-08 NOTE — Progress Notes (Signed)
RGYN patient presents for Post Op Visit  following Dilation and Evacuation  On 04/18/2017.  Pt has no concerns today.

## 2017-05-08 NOTE — Progress Notes (Signed)
47 yo [id number] here for post op check s/p D&E for the treatment of missed abortion. Patient had a failed IVF pregnancy and failed medical management with cytotec x2. Patient reports feeling well without complaints. She denies any fever or abnormal bleeding or pelvic pain  Past Medical History:  Diagnosis Date  . Eczema    as a child, no current problems  . Hypothyroid   . PONV (postoperative nausea and vomiting)   . Pre-diabetes    no meds - diet controlled   Past Surgical History:  Procedure Laterality Date  . CESAREAN SECTION  2012, 2016   x 2  . DILATION AND CURETTAGE OF UTERUS     x 2 - MAB  . DILATION AND EVACUATION N/A 04/18/2017   Procedure: DILATATION AND EVACUATION;  Surgeon: Mora Bellman, MD;  Location: [facility];  Service: Gynecology;  Laterality: N/A;  . KNEE CARTILAGE SURGERY Bilateral 1998, 2018   1998 left, 2018 right  . LAPAROSCOPY  2013   blocked fallopian tube  . WISDOM TOOTH EXTRACTION     Family History  Problem Relation Age of Onset  . Allergic rhinitis Neg Hx   . Angioedema Neg Hx   . Asthma Neg Hx   . Eczema Neg Hx   . Urticaria Neg Hx   . Immunodeficiency Neg Hx    Social History   Tobacco Use  . Smoking status: Never Smoker  . Smokeless tobacco: Never Used  Substance Use Topics  . Alcohol use: Yes    Comment: socially but none with pregnancy  . Drug use: No   ROS See pertinent in HPI  GENERAL: Well-developed, well-nourished female in no acute distress.  ABDOMEN: Soft, nontender, nondistended. No organomegaly. PELVIC: Normal external female genitalia. Vagina is pink and rugated.  Normal discharge. Normal appearing cervix. Uterus is normal in size. No adnexal mass or tenderness.  EXTREMITIES: No cyanosis, clubbing, or edema, 2+ distal pulses.   A/P 47 yo s/p D&E here for post op check - Patient is medically cleared to resume regular activities - Previously discussed follow up with Dr. Gala Romney if interested in pursuing another round of IVF  with already frozen embryos - RTC prn  - Patient reports normal pap smear and mammogram in 2018

## 2017-05-09 LAB — BETA HCG QUANT (REF LAB): hCG Quant: 8 m[IU]/mL

## 2017-06-28 ENCOUNTER — Telehealth: Payer: Self-pay | Admitting: Nurse Practitioner

## 2017-06-28 NOTE — Telephone Encounter (Signed)
Called client back in response to a question about whether she should be charged for transabdominal and transvaginal ultrasound.  As her first ultrasound during this pregnancy, both ultrasounds are a standard order.  And for her in particular, her uterus extended beyond the urinary bladder and the top of the uterus would not have been able to be seen with only a transvaginal ultrasound.  So both ultrasounds were needed to evaluate her condition.  Reviewed the information with the client.  She had no further questions.  ]  Earlie Server, RN, MSN, NP-BC Nurse Practitioner, [facility] for [employer], [place] [facility] 06/28/2017 5:04 PM

## 2018-02-14 ENCOUNTER — Encounter (HOSPITAL_COMMUNITY): Payer: Self-pay

## 2018-03-01 ENCOUNTER — Other Ambulatory Visit: Payer: Self-pay | Admitting: Nurse Practitioner

## 2018-03-01 DIAGNOSIS — G8929 Other chronic pain: Secondary | ICD-10-CM

## 2018-03-01 DIAGNOSIS — M545 Low back pain: Secondary | ICD-10-CM

## 2018-03-01 DIAGNOSIS — S3992XA Unspecified injury of lower back, initial encounter: Secondary | ICD-10-CM

## 2018-03-16 ENCOUNTER — Other Ambulatory Visit: Payer: BLUE CROSS/BLUE SHIELD

## 2020-04-15 ENCOUNTER — Encounter: Payer: Self-pay | Admitting: Gastroenterology

## 2020-04-28 ENCOUNTER — Ambulatory Visit (AMBULATORY_SURGERY_CENTER): Payer: BC Managed Care – PPO | Admitting: *Deleted

## 2020-04-28 ENCOUNTER — Other Ambulatory Visit: Payer: Self-pay

## 2020-04-28 VITALS — Ht 64.0 in | Wt 213.0 lb

## 2020-04-28 DIAGNOSIS — Z1211 Encounter for screening for malignant neoplasm of colon: Secondary | ICD-10-CM

## 2020-04-28 MED ORDER — PLENVU 140 G PO SOLR: 1.0000 | ORAL | 0 refills | Status: DC

## 2020-04-28 NOTE — Progress Notes (Signed)
Pt verified name, DOB, address and insurance during PV today. Pt mailed instruction packet to included paper to complete and mail back to [facility] with addressed and stamped envelope, Emmi video, copy of consent form to read and not return, and instructions. Plenvu  coupon mailed in packet. PV completed over the phone. Pt encouraged to call with questions or issues   No egg or soy allergy known to patient  No issues with past sedation with any surgeries or procedures No intubation problems in the past  No FH of Malignant Hyperthermia No diet pills per patient No home 02 use per patient  No blood thinners per patient  Pt denies issues with constipation  No A fib or A flutter  EMMI video to pt or via [place] 19 guidelines implemented in PV today with Pt and RN  Pt is fully vaccinated  for Covid   Plenvu  Coupon given to pt in PV today , Code to Pharmacy and  NO PA's for preps discussed with pt  In PV today   Due to the COVID-19 pandemic we are asking patients to follow certain guidelines.  Pt aware of COVID protocols and [facility] guidelines

## 2020-05-01 ENCOUNTER — Telehealth: Payer: Self-pay | Admitting: Gastroenterology

## 2020-05-01 DIAGNOSIS — Z1211 Encounter for screening for malignant neoplasm of colon: Secondary | ICD-10-CM

## 2020-05-01 MED ORDER — NA SULFATE-K SULFATE-MG SULF 17.5-3.13-1.6 GM/177ML PO SOLN
ORAL | 0 refills | Status: DC
Start: 2020-05-01 — End: 2020-07-17

## 2020-05-01 NOTE — Telephone Encounter (Signed)
Patient called back stating she spoke with insurance and Suprep is covered under them and is requesting that be sent to the pharmacy instead please.

## 2020-05-01 NOTE — Addendum Note (Signed)
Addended by: Levonne Spiller on: 05/01/2020 12:29 PM   Modules accepted: Orders

## 2020-05-01 NOTE — Telephone Encounter (Signed)
Inbound call from patient stating Plenvu is not covered by her insurance and is requesting a list of other preps so she can call insurance and see what is covered.  Please advise.

## 2020-05-01 NOTE — Telephone Encounter (Signed)
Explained the Golytely prep and patient decided that she did not want to do that prep. She will continue with the Plenvu.

## 2020-05-01 NOTE — Telephone Encounter (Signed)
New Suprep rx sent to pt's pharmacy per pt's request. New Suprep prep instructions sent to pt's Mychart.

## 2020-05-15 ENCOUNTER — Encounter: Payer: BLUE CROSS/BLUE SHIELD | Admitting: Gastroenterology

## 2020-07-02 ENCOUNTER — Ambulatory Visit (AMBULATORY_SURGERY_CENTER): Payer: BC Managed Care – PPO

## 2020-07-02 VITALS — Ht 64.0 in | Wt 213.0 lb

## 2020-07-02 DIAGNOSIS — Z1211 Encounter for screening for malignant neoplasm of colon: Secondary | ICD-10-CM

## 2020-07-02 NOTE — Progress Notes (Signed)
No egg or soy allergy known to patient  No issues with past sedation with any surgeries or procedures Patient denies ever being told they had issues or difficulty with intubation  No FH of Malignant Hyperthermia No diet pills per patient No home 02 use per patient  No blood thinners per patient  Pt denies issues with constipation  No A fib or A flutter  EMMI video to pt or via [place] 19 guidelines implemented in [place] today with Pt and RN  Pt is fully vaccinated  for Covid   Pt has suprep from previous previsit in February, pt states no changes or new dx since February previsit.  Due to the COVID-19 pandemic we are asking patients to follow certain guidelines.  Pt aware of COVID protocols and [facility] guidelines

## 2020-07-13 ENCOUNTER — Encounter: Payer: Self-pay | Admitting: Gastroenterology

## 2020-07-14 ENCOUNTER — Encounter: Payer: Self-pay | Admitting: Gastroenterology

## 2020-07-17 ENCOUNTER — Encounter: Payer: Self-pay | Admitting: Gastroenterology

## 2020-07-17 ENCOUNTER — Ambulatory Visit (AMBULATORY_SURGERY_CENTER): Payer: BC Managed Care – PPO | Admitting: Gastroenterology

## 2020-07-17 ENCOUNTER — Other Ambulatory Visit: Payer: Self-pay

## 2020-07-17 VITALS — BP 100/73 | HR 58 | Temp 96.9°F | Resp 16 | Ht 64.0 in | Wt 213.0 lb

## 2020-07-17 DIAGNOSIS — Z1211 Encounter for screening for malignant neoplasm of colon: Secondary | ICD-10-CM | POA: Diagnosis present

## 2020-07-17 DIAGNOSIS — D124 Benign neoplasm of descending colon: Secondary | ICD-10-CM

## 2020-07-17 DIAGNOSIS — D122 Benign neoplasm of ascending colon: Secondary | ICD-10-CM

## 2020-07-17 DIAGNOSIS — D125 Benign neoplasm of sigmoid colon: Secondary | ICD-10-CM

## 2020-07-17 MED ORDER — SODIUM CHLORIDE 0.9 % IV SOLN: 500.0000 mL | Freq: Once | INTRAVENOUS | Status: AC

## 2020-07-17 NOTE — Progress Notes (Signed)
A/ox3, pleased with MAC, report to RN 

## 2020-07-17 NOTE — Op Note (Signed)
[facility] Patient Name: Tamara Bender Procedure Date: 07/17/2020 11:49 AM MRN: [id number] Endoscopist: Thornton Park MD, MD Age: 50 Referring MD:  Date of Birth: [birth year] Gender: Female Account #: [id number] Procedure:                Colonoscopy Indications:              Screening for colorectal malignant neoplasm, This                            is the patient's first colonoscopy                           No known family history of colon cancer or polyps Medicines:                Monitored Anesthesia Care Procedure:                Pre-Anesthesia Assessment:                           - Prior to the procedure, a History and Physical                            was performed, and patient medications and                            allergies were reviewed. The patient's tolerance of                            previous anesthesia was also reviewed. The risks                            and benefits of the procedure and the sedation                            options and risks were discussed with the patient.                            All questions were answered, and informed consent                            was obtained. Prior Anticoagulants: The patient has                            taken no previous anticoagulant or antiplatelet                            agents. ASA Grade Assessment: II - A patient with                            mild systemic disease. After reviewing the risks                            and benefits, the patient was deemed in  satisfactory condition to undergo the procedure.                           After obtaining informed consent, the colonoscope                            was passed under direct vision. Throughout the                            procedure, the patient's blood pressure, pulse, and                            oxygen saturations were monitored continuously. The                            Colonoscope was  introduced through the anus and                            advanced to the 3 cm into the ileum. A second                            forward view of the right colon was performed. The                            colonoscopy was performed without difficulty. The                            patient tolerated the procedure well. The quality                            of the bowel preparation was good. The terminal                            ileum, ileocecal valve, appendiceal orifice, and                            rectum were photographed. Scope In: 11:58:57 AM Scope Out: 12:12:11 PM Scope Withdrawal Time: 0 hours 10 minutes 26 seconds  Total Procedure Duration: 0 hours 13 minutes 14 seconds  Findings:                 The perianal and digital rectal examinations were                            normal.                           Three sessile polyps were found in the sigmoid                            colon, descending colon and ascending colon. The                            polyps were 1-2 mm in size. These polyps were  removed with a cold snare. Resection was complete                            but only two were retrieved. Estimated blood loss                            was minimal.                           The exam was otherwise without abnormality on                            direct and retroflexion views. Complications:            No immediate complications. Estimated blood loss:                            Minimal. Estimated Blood Loss:     Estimated blood loss was minimal. Impression:               - Three 1 mm polyps in the sigmoid colon, in the                            descending colon and in the ascending colon,                            removed with a cold snare. Resected and retrieved.                           - The examination was otherwise normal on direct                            and retroflexion views. Recommendation:           - Patient has a  contact number available for                            emergencies. The signs and symptoms of potential                            delayed complications were discussed with the                            patient. Return to normal activities tomorrow.                            Written discharge instructions were provided to the                            patient.                           - Resume previous diet.                           - Continue present medications.                           -  Await pathology results.                           - Repeat colonoscopy in 3 years if the retrieved                            polyps are both adenomas, otherwise surveillance                            colonoscopy in 5 years as one polyp was not                            retrieved.                           - Emerging evidence supports eating a diet of                            fruits, vegetables, grains, calcium, and yogurt                            while reducing red meat and alcohol may reduce the                            risk of colon cancer.                           - Thank you for allowing me to be involved in your                            colon cancer prevention. Tressia Danas MD, MD 07/17/2020 12:18:03 PM This report has been signed electronically.

## 2020-07-17 NOTE — Progress Notes (Signed)
Medical history reviewed with no changes noted. VS assessed by A.G 

## 2020-07-17 NOTE — Progress Notes (Signed)
Called to room to assist during endoscopic procedure.  Patient ID and intended procedure confirmed with present staff. Received instructions for my participation in the procedure from the performing physician.  

## 2020-07-17 NOTE — Patient Instructions (Signed)
Resume previous diet and medications. Awaiting pathology, repeat colonoscopy date to be determined based on results.  YOU HAD AN ENDOSCOPIC PROCEDURE TODAY AT [facility]:   Refer to the procedure report that was given to you for any specific questions about what was found during the examination.  If the procedure report does not answer your questions, please call your gastroenterologist to clarify.  If you requested that your care partner not be given the details of your procedure findings, then the procedure report has been included in a sealed envelope for you to review at your convenience later.  YOU SHOULD EXPECT: Some feelings of bloating in the abdomen. Passage of more gas than usual.  Walking can help get rid of the air that was put into your GI tract during the procedure and reduce the bloating. If you had a lower endoscopy (such as a colonoscopy or flexible sigmoidoscopy) you may notice spotting of blood in your stool or on the toilet paper. If you underwent a bowel prep for your procedure, you may not have a normal bowel movement for a few days.  Please Note:  You might notice some irritation and congestion in your nose or some drainage.  This is from the oxygen used during your procedure.  There is no need for concern and it should clear up in a day or so.  SYMPTOMS TO REPORT IMMEDIATELY:   Following lower endoscopy (colonoscopy or flexible sigmoidoscopy):  Excessive amounts of blood in the stool  Significant tenderness or worsening of abdominal pains  Swelling of the abdomen that is new, acute  Fever of 100F or higher   For urgent or emergent issues, a gastroenterologist can be reached at any hour by calling [phone]. Do not use MyChart messaging for urgent concerns.    DIET:  We do recommend a small meal at first, but then you may proceed to your regular diet.  Drink plenty of fluids but you should avoid alcoholic beverages for 24 hours.  ACTIVITY:  You  should plan to take it easy for the rest of today and you should NOT DRIVE or use heavy machinery until tomorrow (because of the sedation medicines used during the test).    FOLLOW UP: Our staff will call the number listed on your records 48-72 hours following your procedure to check on you and address any questions or concerns that you may have regarding the information given to you following your procedure. If we do not reach you, we will leave a message.  We will attempt to reach you two times.  During this call, we will ask if you have developed any symptoms of COVID 19. If you develop any symptoms (ie: fever, flu-like symptoms, shortness of breath, cough etc.) before then, please call [phone].  If you test positive for Covid 19 in the 2 weeks post procedure, please call and report this information to [place].    If any biopsies were taken you will be contacted by phone or by letter within the next 1-3 weeks.  Please call us at [phone] if you have not heard about the biopsies in 3 weeks.    SIGNATURES/CONFIDENTIALITY: You and/or your care partner have signed paperwork which will be entered into your electronic medical record.  These signatures attest to the fact that that the information above on your After Visit Summary has been reviewed and is understood.  Full responsibility of the confidentiality of this discharge information lies with you and/or your care-partner.

## 2020-07-21 ENCOUNTER — Telehealth: Payer: Self-pay

## 2020-07-21 ENCOUNTER — Telehealth: Payer: Self-pay | Admitting: *Deleted

## 2020-07-21 NOTE — Telephone Encounter (Signed)
Attempted 2nd f/u phone call. No answer. Left message.  °

## 2020-07-21 NOTE — Telephone Encounter (Signed)
Called [phone] and left a message we tried to reach pt for a follow up call. maw

## 2020-07-24 ENCOUNTER — Telehealth: Payer: Self-pay | Admitting: Gastroenterology

## 2020-07-24 ENCOUNTER — Encounter: Payer: Self-pay | Admitting: Gastroenterology

## 2020-07-24 NOTE — Telephone Encounter (Signed)
Inbound call from patient requesting her colonoscopy results please.

## 2020-07-27 NOTE — Telephone Encounter (Signed)
Discussed results letter with Tamara Bender. Tamara Bender wants to ask a questions of Dr. Tarri Glenn. Tamara Bender wants to know what percentage of precancerous polyps like she had turn into cancer. Please advise.

## 2020-07-27 NOTE — Telephone Encounter (Signed)
Spoke with pt and she is aware.

## 2021-02-25 ENCOUNTER — Other Ambulatory Visit: Payer: Self-pay | Admitting: Obstetrics and Gynecology

## 2021-02-25 DIAGNOSIS — Z803 Family history of malignant neoplasm of breast: Secondary | ICD-10-CM

## 2021-03-29 ENCOUNTER — Other Ambulatory Visit (HOSPITAL_COMMUNITY): Payer: Self-pay

## 2021-03-30 ENCOUNTER — Other Ambulatory Visit (HOSPITAL_COMMUNITY): Payer: Self-pay

## 2021-03-30 MED ORDER — MOUNJARO 2.5 MG/0.5ML ~~LOC~~ SOAJ
2.5000 mg | SUBCUTANEOUS | 5 refills | Status: DC
  Filled 2021-03-30 – 2021-04-20 (×3): qty 2, 28d supply, fill #0

## 2021-04-01 ENCOUNTER — Other Ambulatory Visit (HOSPITAL_COMMUNITY): Payer: Self-pay

## 2021-04-15 ENCOUNTER — Other Ambulatory Visit (HOSPITAL_COMMUNITY): Payer: Self-pay

## 2021-04-15 MED ORDER — OZEMPIC (1 MG/DOSE) 4 MG/3ML ~~LOC~~ SOPN
1.0000 mg | PEN_INJECTOR | SUBCUTANEOUS | 11 refills | Status: DC
  Filled 2021-04-15 – 2021-04-20 (×2): qty 3, 28d supply, fill #0

## 2021-04-20 ENCOUNTER — Other Ambulatory Visit (HOSPITAL_COMMUNITY): Payer: Self-pay

## 2021-04-21 ENCOUNTER — Other Ambulatory Visit (HOSPITAL_COMMUNITY): Payer: Self-pay

## 2021-04-22 ENCOUNTER — Other Ambulatory Visit (HOSPITAL_COMMUNITY): Payer: Self-pay

## 2021-04-22 MED ORDER — MOUNJARO 5 MG/0.5ML ~~LOC~~ SOAJ
5.0000 mg | SUBCUTANEOUS | 5 refills | Status: DC
  Filled 2021-04-22 – 2021-05-12 (×3): qty 2, 28d supply, fill #0
  Filled 2021-06-09: qty 2, 28d supply, fill #1
  Filled 2021-07-01: qty 2, 28d supply, fill #2

## 2021-05-03 ENCOUNTER — Other Ambulatory Visit (HOSPITAL_COMMUNITY): Payer: Self-pay

## 2021-05-12 ENCOUNTER — Other Ambulatory Visit (HOSPITAL_COMMUNITY): Payer: Self-pay

## 2021-06-09 ENCOUNTER — Other Ambulatory Visit (HOSPITAL_COMMUNITY): Payer: Self-pay

## 2021-06-28 ENCOUNTER — Other Ambulatory Visit (HOSPITAL_COMMUNITY): Payer: Self-pay

## 2021-06-28 MED ORDER — MOUNJARO 7.5 MG/0.5ML ~~LOC~~ SOAJ
7.5000 mg | SUBCUTANEOUS | 5 refills | Status: DC
  Filled 2021-06-28 – 2021-07-06 (×2): qty 2, 28d supply, fill #0
  Filled 2021-07-28: qty 2, 28d supply, fill #1
  Filled 2021-08-11 – 2021-08-19 (×2): qty 2, 28d supply, fill #2
  Filled 2021-09-16: qty 2, 28d supply, fill #3

## 2021-06-28 MED ORDER — MOUNJARO 7.5 MG/0.5ML ~~LOC~~ SOAJ
7.5000 mg | SUBCUTANEOUS | 5 refills | Status: DC
  Filled 2021-06-28: qty 0.5, 70d supply, fill #0

## 2021-06-29 ENCOUNTER — Other Ambulatory Visit (HOSPITAL_COMMUNITY): Payer: Self-pay

## 2021-07-01 ENCOUNTER — Other Ambulatory Visit (HOSPITAL_COMMUNITY): Payer: Self-pay

## 2021-07-06 ENCOUNTER — Other Ambulatory Visit (HOSPITAL_COMMUNITY): Payer: Self-pay

## 2021-07-08 ENCOUNTER — Ambulatory Visit
Admission: RE | Admit: 2021-07-08 | Discharge: 2021-07-08 | Disposition: A | Discharge status: Home / Self Care | Payer: BC Managed Care – PPO | Source: Ambulatory Visit | Attending: Obstetrics and Gynecology | Admitting: Obstetrics and Gynecology

## 2021-07-08 DIAGNOSIS — Z803 Family history of malignant neoplasm of breast: Secondary | ICD-10-CM

## 2021-07-08 MED ORDER — GADOBUTROL 1 MMOL/ML IV SOLN
10.0000 mL | Freq: Once | INTRAVENOUS | Status: AC | PRN
  Administered 2021-07-08: 10 mL via INTRAVENOUS

## 2021-07-28 ENCOUNTER — Other Ambulatory Visit (HOSPITAL_COMMUNITY): Payer: Self-pay

## 2021-08-11 ENCOUNTER — Other Ambulatory Visit (HOSPITAL_COMMUNITY): Payer: Self-pay

## 2021-08-19 ENCOUNTER — Other Ambulatory Visit (HOSPITAL_COMMUNITY): Payer: Self-pay

## 2021-08-30 ENCOUNTER — Other Ambulatory Visit (HOSPITAL_COMMUNITY): Payer: Self-pay

## 2021-08-30 MED ORDER — MOUNJARO 10 MG/0.5ML ~~LOC~~ SOAJ
10.0000 mg | SUBCUTANEOUS | 5 refills | Status: AC
  Filled 2021-08-30 – 2021-10-06 (×3): qty 2, 28d supply, fill #0
  Filled 2021-10-25 – 2021-10-28 (×2): qty 2, 28d supply, fill #1
  Filled 2021-11-18 – 2021-11-19 (×2): qty 2, 28d supply, fill #2

## 2021-09-01 ENCOUNTER — Other Ambulatory Visit (HOSPITAL_COMMUNITY): Payer: Self-pay

## 2021-09-10 ENCOUNTER — Other Ambulatory Visit (HOSPITAL_COMMUNITY): Payer: Self-pay

## 2021-09-16 ENCOUNTER — Other Ambulatory Visit (HOSPITAL_COMMUNITY): Payer: Self-pay

## 2021-10-06 ENCOUNTER — Other Ambulatory Visit (HOSPITAL_COMMUNITY): Payer: Self-pay

## 2021-10-25 ENCOUNTER — Other Ambulatory Visit (HOSPITAL_COMMUNITY): Payer: Self-pay

## 2021-10-28 ENCOUNTER — Other Ambulatory Visit (HOSPITAL_COMMUNITY): Payer: Self-pay

## 2021-11-18 ENCOUNTER — Other Ambulatory Visit (HOSPITAL_COMMUNITY): Payer: Self-pay

## 2021-11-19 ENCOUNTER — Other Ambulatory Visit (HOSPITAL_COMMUNITY): Payer: Self-pay

## 2021-11-23 ENCOUNTER — Other Ambulatory Visit (HOSPITAL_COMMUNITY): Payer: Self-pay

## 2021-11-25 ENCOUNTER — Other Ambulatory Visit (HOSPITAL_COMMUNITY): Payer: Self-pay

## 2021-11-26 ENCOUNTER — Other Ambulatory Visit (HOSPITAL_COMMUNITY): Payer: Self-pay

## 2021-12-16 ENCOUNTER — Other Ambulatory Visit (HOSPITAL_COMMUNITY): Payer: Self-pay

## 2021-12-16 MED ORDER — MOUNJARO 10 MG/0.5ML ~~LOC~~ SOAJ
10.0000 mg | SUBCUTANEOUS | 5 refills | Status: DC
  Filled 2021-12-16: qty 2, 28d supply, fill #0

## 2021-12-17 ENCOUNTER — Other Ambulatory Visit (HOSPITAL_COMMUNITY): Payer: Self-pay

## 2021-12-17 MED ORDER — MOUNJARO 12.5 MG/0.5ML ~~LOC~~ SOAJ
12.5000 mg | SUBCUTANEOUS | 5 refills | Status: DC
  Filled 2021-12-17: qty 2, 28d supply, fill #0
  Filled 2022-01-05 – 2022-01-10 (×2): qty 2, 28d supply, fill #1
  Filled 2022-02-01: qty 2, 28d supply, fill #2
  Filled 2022-02-25 (×5): qty 2, 28d supply, fill #3
  Filled 2022-03-25 (×2): qty 2, 28d supply, fill #4
  Filled 2022-04-15: qty 2, 28d supply, fill #5

## 2022-01-05 ENCOUNTER — Other Ambulatory Visit (HOSPITAL_COMMUNITY): Payer: Self-pay

## 2022-01-10 ENCOUNTER — Other Ambulatory Visit (HOSPITAL_COMMUNITY): Payer: Self-pay

## 2022-02-01 ENCOUNTER — Other Ambulatory Visit (HOSPITAL_COMMUNITY): Payer: Self-pay

## 2022-02-25 ENCOUNTER — Other Ambulatory Visit (HOSPITAL_COMMUNITY): Payer: Self-pay

## 2022-03-25 ENCOUNTER — Other Ambulatory Visit (HOSPITAL_COMMUNITY): Payer: Self-pay

## 2022-04-15 ENCOUNTER — Other Ambulatory Visit (HOSPITAL_COMMUNITY): Payer: Self-pay

## 2022-05-12 ENCOUNTER — Other Ambulatory Visit (HOSPITAL_COMMUNITY): Payer: Self-pay

## 2022-05-13 ENCOUNTER — Other Ambulatory Visit (HOSPITAL_COMMUNITY): Payer: Self-pay

## 2022-05-13 MED ORDER — MOUNJARO 12.5 MG/0.5ML ~~LOC~~ SOAJ
12.5000 mg | SUBCUTANEOUS | 5 refills | Status: AC
  Filled 2022-05-13: qty 2, 28d supply, fill #0
  Filled 2022-06-16: qty 2, 28d supply, fill #1
  Filled 2022-07-22: qty 2, 28d supply, fill #2

## 2022-05-24 ENCOUNTER — Other Ambulatory Visit (HOSPITAL_COMMUNITY): Payer: Self-pay

## 2022-06-20 ENCOUNTER — Other Ambulatory Visit (HOSPITAL_COMMUNITY): Payer: Self-pay

## 2022-06-29 ENCOUNTER — Other Ambulatory Visit (HOSPITAL_COMMUNITY): Payer: Self-pay

## 2022-07-04 ENCOUNTER — Other Ambulatory Visit (HOSPITAL_COMMUNITY): Payer: Self-pay

## 2022-07-13 ENCOUNTER — Other Ambulatory Visit (HOSPITAL_COMMUNITY): Payer: Self-pay

## 2022-07-22 ENCOUNTER — Other Ambulatory Visit (HOSPITAL_COMMUNITY): Payer: Self-pay

## 2022-07-26 ENCOUNTER — Other Ambulatory Visit (HOSPITAL_COMMUNITY): Payer: Self-pay

## 2022-07-26 MED ORDER — MOUNJARO 15 MG/0.5ML ~~LOC~~ SOAJ
15.0000 mg | SUBCUTANEOUS | 5 refills | Status: DC
  Filled 2022-07-26: qty 2, 28d supply, fill #0
  Filled 2022-08-23: qty 2, 28d supply, fill #1
  Filled 2022-09-19: qty 2, 28d supply, fill #2
  Filled 2022-10-12 (×2): qty 2, 28d supply, fill #3
  Filled 2022-11-03 – 2022-11-07 (×2): qty 2, 28d supply, fill #4
  Filled 2022-12-02 – 2022-12-06 (×2): qty 2, 28d supply, fill #5

## 2022-08-01 ENCOUNTER — Other Ambulatory Visit (HOSPITAL_COMMUNITY): Payer: Self-pay

## 2022-08-05 ENCOUNTER — Other Ambulatory Visit (HOSPITAL_COMMUNITY): Payer: Self-pay

## 2022-08-23 ENCOUNTER — Other Ambulatory Visit: Payer: Self-pay

## 2022-08-23 ENCOUNTER — Other Ambulatory Visit (HOSPITAL_COMMUNITY): Payer: Self-pay

## 2022-10-12 ENCOUNTER — Other Ambulatory Visit (HOSPITAL_COMMUNITY): Payer: Self-pay

## 2022-11-03 ENCOUNTER — Other Ambulatory Visit: Payer: Self-pay

## 2022-11-04 ENCOUNTER — Other Ambulatory Visit (HOSPITAL_COMMUNITY): Payer: Self-pay

## 2022-11-07 ENCOUNTER — Other Ambulatory Visit (HOSPITAL_COMMUNITY): Payer: Self-pay

## 2022-11-08 ENCOUNTER — Other Ambulatory Visit (HOSPITAL_COMMUNITY): Payer: Self-pay

## 2022-12-02 ENCOUNTER — Other Ambulatory Visit (HOSPITAL_COMMUNITY): Payer: Self-pay

## 2022-12-05 ENCOUNTER — Other Ambulatory Visit (HOSPITAL_COMMUNITY): Payer: Self-pay

## 2022-12-06 ENCOUNTER — Other Ambulatory Visit (HOSPITAL_COMMUNITY): Payer: Self-pay

## 2023-01-03 ENCOUNTER — Other Ambulatory Visit: Payer: Self-pay

## 2023-01-03 ENCOUNTER — Other Ambulatory Visit (HOSPITAL_COMMUNITY): Payer: Self-pay

## 2023-01-03 MED ORDER — TIRZEPATIDE 15 MG/0.5ML ~~LOC~~ SOAJ
15.0000 mg | SUBCUTANEOUS | 5 refills | Status: DC
  Filled 2023-01-03: qty 2, 28d supply, fill #0
  Filled 2023-01-26 – 2023-01-31 (×2): qty 2, 28d supply, fill #1
  Filled 2023-02-27 – 2023-02-28 (×2): qty 2, 28d supply, fill #2
  Filled 2023-04-06 – 2023-04-12 (×3): qty 2, 28d supply, fill #3
  Filled 2023-05-10: qty 2, 28d supply, fill #4
  Filled 2023-06-05: qty 2, 28d supply, fill #5

## 2023-01-26 ENCOUNTER — Other Ambulatory Visit (HOSPITAL_COMMUNITY): Payer: Self-pay

## 2023-01-31 ENCOUNTER — Other Ambulatory Visit (HOSPITAL_COMMUNITY): Payer: Self-pay

## 2023-02-27 ENCOUNTER — Other Ambulatory Visit (HOSPITAL_COMMUNITY): Payer: Self-pay

## 2023-02-28 ENCOUNTER — Other Ambulatory Visit (HOSPITAL_COMMUNITY): Payer: Self-pay

## 2023-04-06 ENCOUNTER — Other Ambulatory Visit (HOSPITAL_COMMUNITY): Payer: Self-pay

## 2023-04-07 ENCOUNTER — Other Ambulatory Visit (HOSPITAL_BASED_OUTPATIENT_CLINIC_OR_DEPARTMENT_OTHER): Payer: Self-pay

## 2023-04-07 ENCOUNTER — Other Ambulatory Visit (HOSPITAL_COMMUNITY): Payer: Self-pay

## 2023-04-12 ENCOUNTER — Other Ambulatory Visit (HOSPITAL_COMMUNITY): Payer: Self-pay

## 2023-04-18 ENCOUNTER — Other Ambulatory Visit (HOSPITAL_COMMUNITY): Payer: Self-pay

## 2023-05-10 ENCOUNTER — Other Ambulatory Visit (HOSPITAL_COMMUNITY): Payer: Self-pay

## 2023-06-05 ENCOUNTER — Other Ambulatory Visit (HOSPITAL_COMMUNITY): Payer: Self-pay

## 2023-06-06 ENCOUNTER — Other Ambulatory Visit (HOSPITAL_COMMUNITY): Payer: Self-pay

## 2023-07-07 ENCOUNTER — Other Ambulatory Visit (HOSPITAL_COMMUNITY): Payer: Self-pay

## 2023-07-07 MED ORDER — MOUNJARO 15 MG/0.5ML ~~LOC~~ SOAJ
15.0000 mg | SUBCUTANEOUS | 5 refills | Status: DC
  Filled 2023-07-07: qty 2, 28d supply, fill #0
  Filled 2023-08-02: qty 2, 28d supply, fill #1
  Filled 2023-08-30: qty 2, 28d supply, fill #2
  Filled 2023-09-27: qty 2, 28d supply, fill #3
  Filled 2023-10-20: qty 2, 28d supply, fill #4
  Filled 2023-11-18: qty 2, 28d supply, fill #5

## 2023-08-02 ENCOUNTER — Other Ambulatory Visit: Payer: Self-pay

## 2023-08-21 IMAGING — MR MR BREAST BILAT WO/W CM
8 of 12 series · 34 of 48 positions shown · IV contrast (10ml gadavist)
Comparison: Priors

CLINICAL DATA: Family history of breast cancer.

EXAM:
BILATERAL BREAST MRI WITH AND WITHOUT CONTRAST
TECHNIQUE: Multiplanar, multisequence MR images of both breasts were obtained
prior to and following the intravenous administration of 10 ml of
Gadavist

[Series 2: t2_tirm_tra ipat (a-p) · axial · 3.0mm · 0.80mm/px · 1 of 67 slices shown]
[im 1/67]
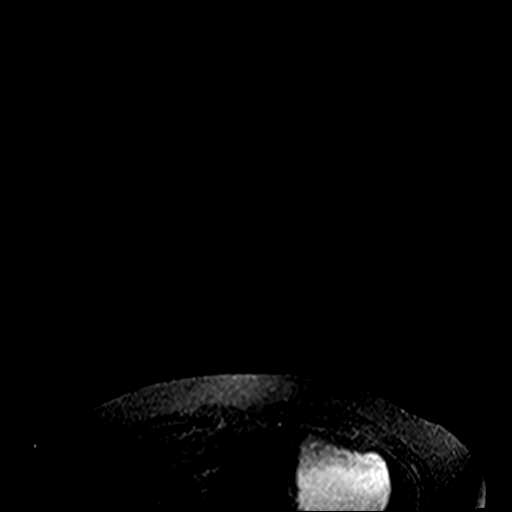

[Series 3: fl3d pre-cm no · axial · non-contrast · 1.2mm · 1.07mm/px · z∈[-133,+77]mm · 5 of 176 slices shown]
[im 1/176]
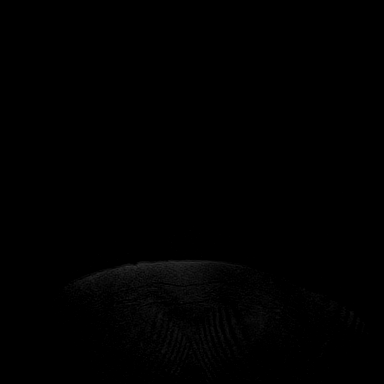
[im 44/176]
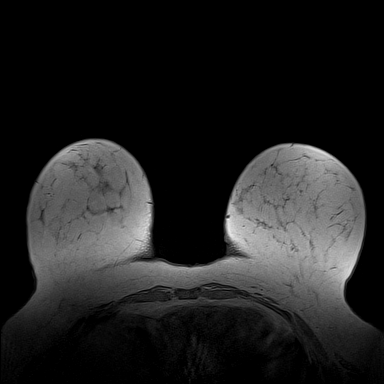
[im 88/176]
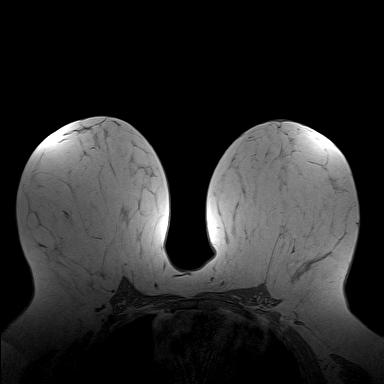
[im 132/176]
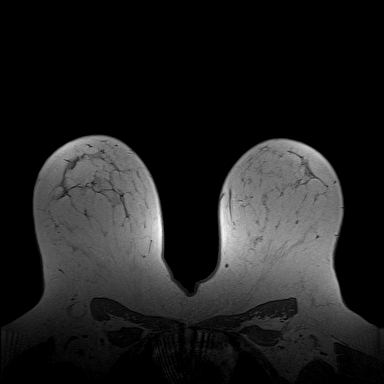
[im 176/176]
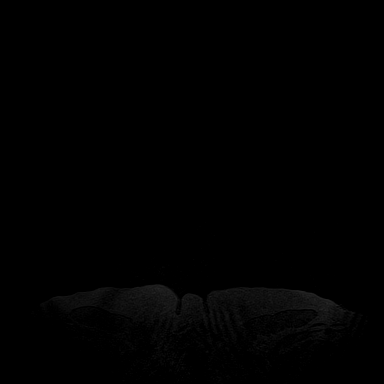

[Series 4: fl3d pre-cm · axial · non-contrast · 1.2mm · 1.07mm/px · z∈[-133,+77]mm · 5 of 176 slices shown]
[im 1/176]
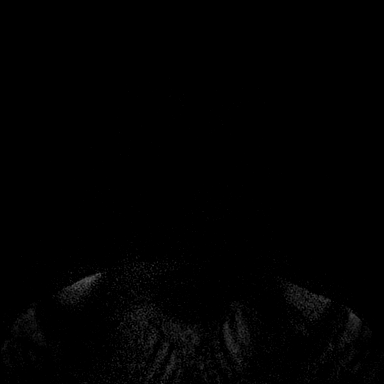
[im 44/176]
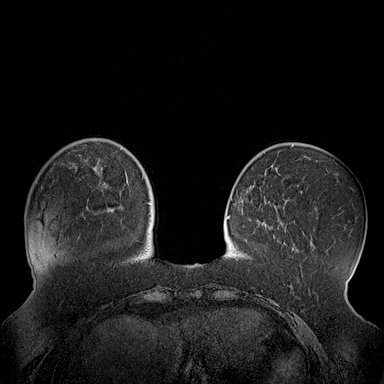
[im 88/176]
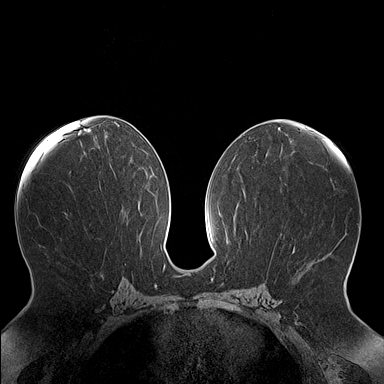
[im 132/176]
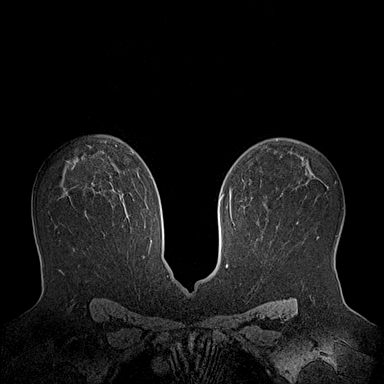
[im 176/176]
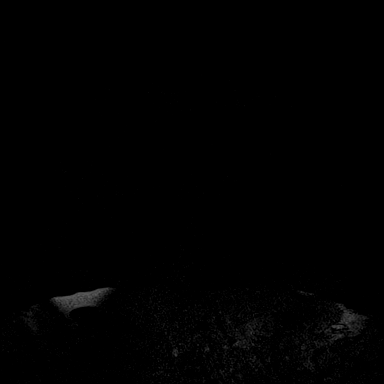

[Series 5: fl3d post-cm 20 · axial · 1.2mm · 1.07mm/px · z∈[-133,+77]mm · 5 of 176 slices shown (1 of 3)]
[im 1/176]
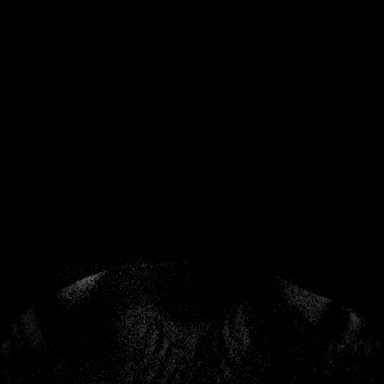
[im 44/176]
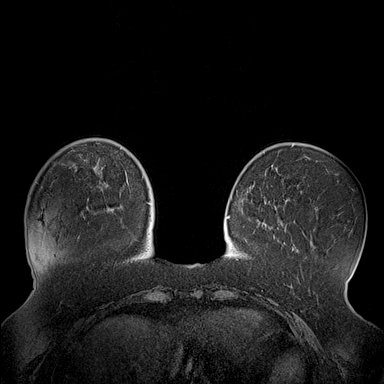
[im 88/176]
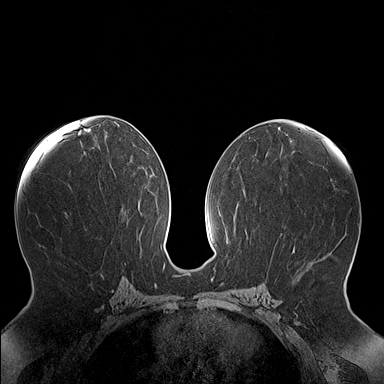
[im 132/176]
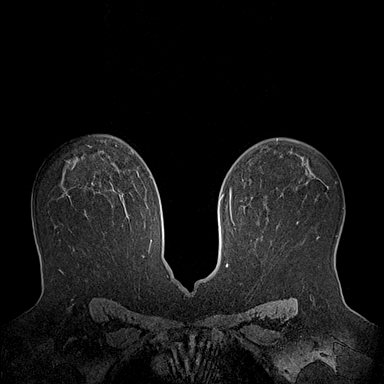
[im 176/176]
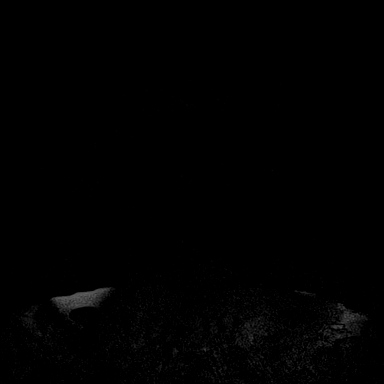

[Series 6: fl3d post-cm 20 · axial · 1.2mm · 1.07mm/px · z∈[-133,+77]mm · 5 of 176 slices shown (2 of 3)]
[im 1/176]
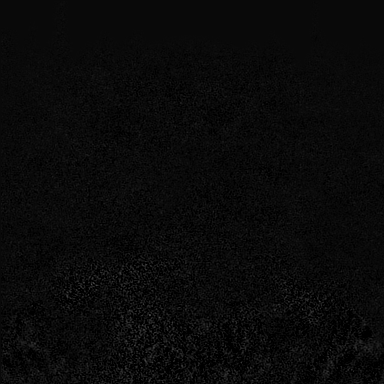
[im 44/176]
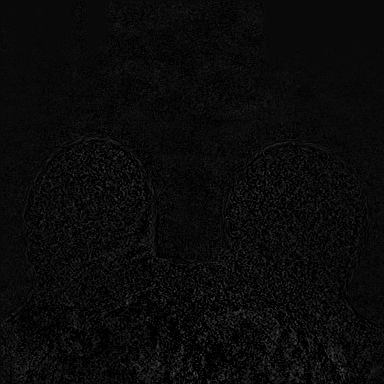
[im 88/176]
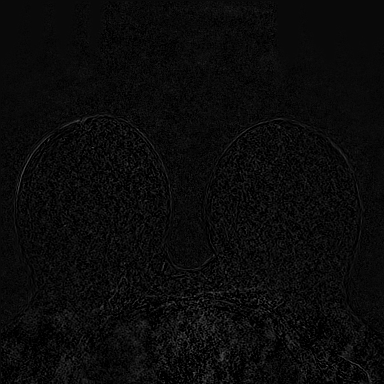
[im 132/176]
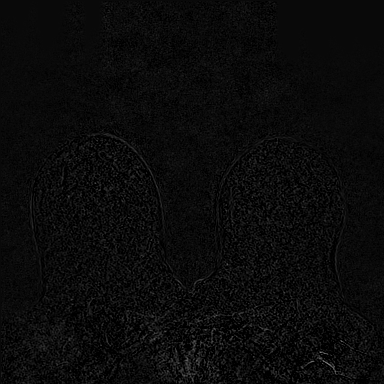
[im 176/176]
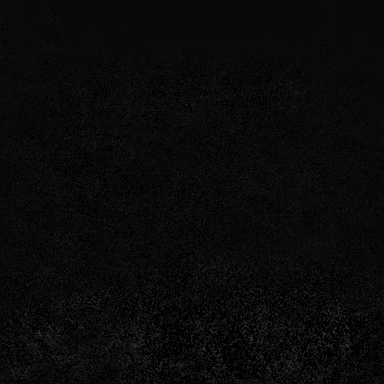

[Series 7: fl3d post-cm 20 · axial · 211.2mm · 1.07mm/px · 1 of 1 slices shown (3 of 3)]
[im 1/1]
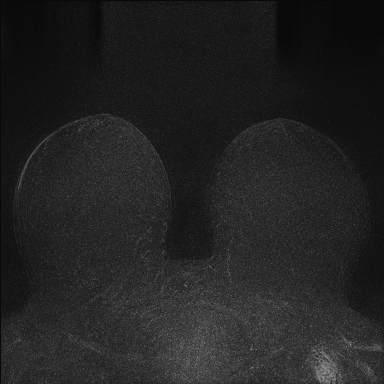

[Series 8: fl3d post-cm 3 · axial · 1.2mm · 1.07mm/px · z∈[-133,+77]mm · 6 of 176 slices shown (1 of 2)]
[im 1/176]
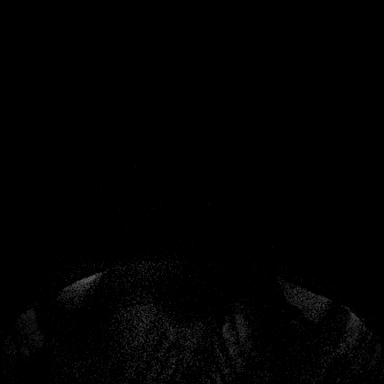
[im 36/176]
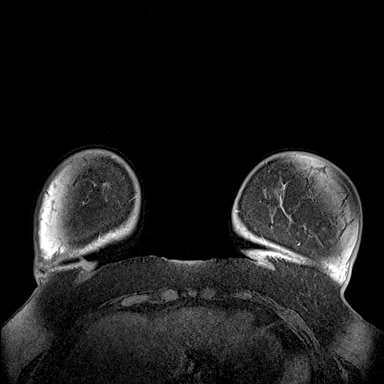
[im 71/176]
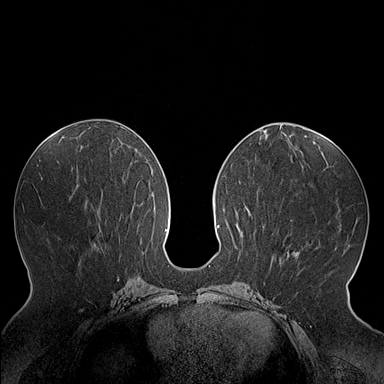
[im 106/176]
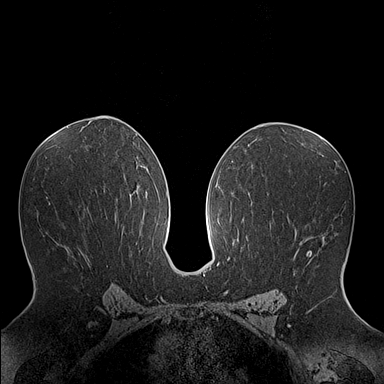
[im 141/176]
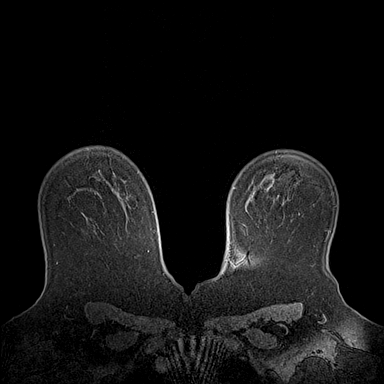
[im 176/176]
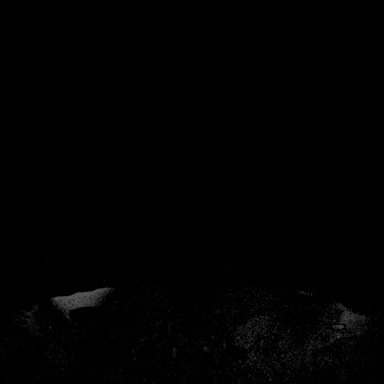

[Series 9: fl3d post-cm 3 · axial · 1.2mm · 1.07mm/px · z∈[-133,+77]mm · 6 of 176 slices shown (2 of 2)]
[im 1/176]
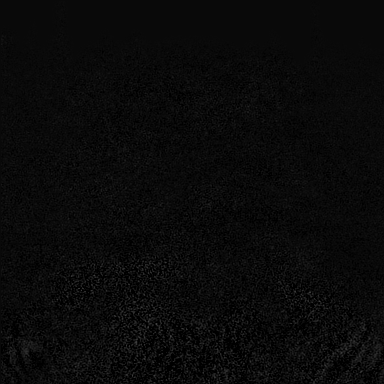
[im 36/176]
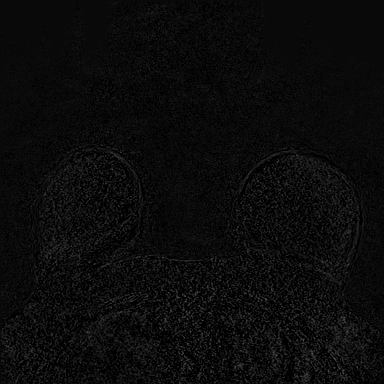
[im 71/176]
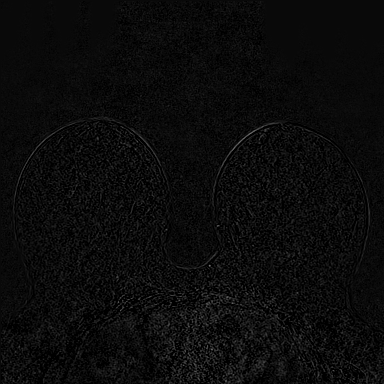
[im 106/176]
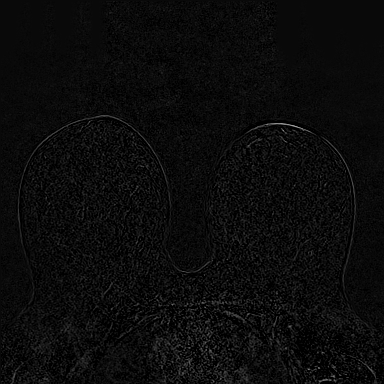
[im 141/176]
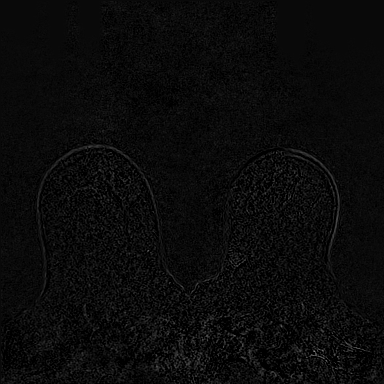
[im 176/176]
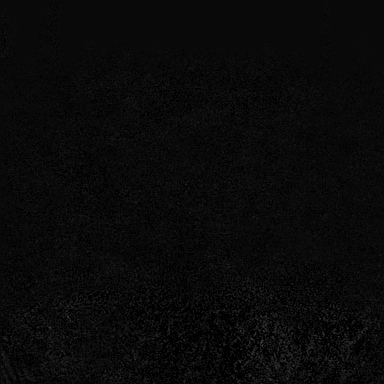

[34 of 48 positions shown; findings below may reference images not displayed]

Three-dimensional MR images were rendered by post-processing of the
original MR data on an independent workstation. The
three-dimensional MR images were interpreted, and findings are
reported in the following complete MRI report for this study. Three
dimensional images were evaluated at the independent interpreting
workstation using the DynaCAD thin client.
FINDINGS: No contrast is demonstrated on the current exam. The exam is
nondiagnostic for the evaluation of the breast tissue. No axillary
adenopathy is identified.
IMPRESSION: Nondiagnostic examination as no contrast is identified.

RECOMMENDATION:
Patient needs to return for additional repeat imaging.

BI-RADS CATEGORY  0: Incomplete. Need additional imaging evaluation
and/or prior mammograms for comparison.

## 2023-08-30 ENCOUNTER — Other Ambulatory Visit (HOSPITAL_COMMUNITY): Payer: Self-pay

## 2023-09-27 ENCOUNTER — Other Ambulatory Visit (HOSPITAL_COMMUNITY): Payer: Self-pay

## 2023-10-18 ENCOUNTER — Other Ambulatory Visit (HOSPITAL_COMMUNITY): Payer: Self-pay

## 2023-10-18 MED ORDER — MOUNJARO 15 MG/0.5ML ~~LOC~~ SOAJ
15.0000 mg | SUBCUTANEOUS | 5 refills | Status: AC
  Filled 2023-10-18 – 2023-12-16 (×3): qty 2, 28d supply, fill #0
  Filled 2024-01-12: qty 2, 28d supply, fill #1
  Filled 2024-03-31 – 2024-04-09 (×2): qty 2, 28d supply, fill #2

## 2023-10-23 ENCOUNTER — Other Ambulatory Visit (HOSPITAL_COMMUNITY): Payer: Self-pay

## 2023-11-22 ENCOUNTER — Other Ambulatory Visit (HOSPITAL_COMMUNITY): Payer: Self-pay

## 2023-11-23 ENCOUNTER — Encounter: Payer: Self-pay | Admitting: Physical Therapy

## 2023-11-23 ENCOUNTER — Ambulatory Visit: Attending: Orthopedic Surgery | Admitting: Physical Therapy

## 2023-11-23 ENCOUNTER — Ambulatory Visit

## 2023-11-23 DIAGNOSIS — R600 Localized edema: Secondary | ICD-10-CM | POA: Diagnosis present

## 2023-11-23 DIAGNOSIS — M25562 Pain in left knee: Secondary | ICD-10-CM | POA: Insufficient documentation

## 2023-11-23 DIAGNOSIS — M25662 Stiffness of left knee, not elsewhere classified: Secondary | ICD-10-CM | POA: Diagnosis present

## 2023-11-23 DIAGNOSIS — R262 Difficulty in walking, not elsewhere classified: Secondary | ICD-10-CM | POA: Insufficient documentation

## 2023-11-23 NOTE — Therapy (Signed)
 OUTPATIENT PHYSICAL THERAPY LOWER EXTREMITY EVALUATION   Patient Name: Tamara Bender MRN: [id number] DOB:06/14/1970, 53 y.o., female Today's Date: 11/23/2023  END OF SESSION:  PT End of Session - 11/23/23 1322     Visit Number 1    Date for PT Re-Evaluation 02/22/24    Authorization Type BCBS    PT Start Time 1315    PT Stop Time 1415    PT Time Calculation (min) 60 min    Activity Tolerance Patient tolerated treatment well    Behavior During Therapy WFL for tasks assessed/performed          Past Medical History:  Diagnosis Date   Allergy    Anxiety    Eczema    as a child,- 55 yo -- no current problems   Hypothyroid    PONV (postoperative nausea and vomiting)    Pre-diabetes    no meds - diet controlled   Past Surgical History:  Procedure Laterality Date   CESAREAN SECTION  2012, 2016   x 2   DILATION AND CURETTAGE OF UTERUS     x 2 - MAB   DILATION AND CURETTAGE OF UTERUS     for 3rd miscarriage    DILATION AND EVACUATION N/A 04/18/2017   Procedure: DILATATION AND EVACUATION;  Surgeon: Alger Gong, MD;  Location: [facility];  Service: Gynecology;  Laterality: N/A;   KNEE CARTILAGE SURGERY Bilateral 1998, 2018   1998 left, 2018 right   LAPAROSCOPY  2013   blocked fallopian tube   WISDOM TOOTH EXTRACTION     Patient Active Problem List   Diagnosis Date Noted   Missed abortion    Mild persistent asthma without complication 01/05/2016   Other allergic rhinitis 01/05/2016   Acute non-recurrent maxillary sinusitis 01/05/2016    PCP: Andrew, MD  REFERRING PROVIDER: Melodi, MD  REFERRING DIAG: s/p left TKA  THERAPY DIAG:  Acute pain of left knee  Stiffness of left knee, not elsewhere classified  Localized edema  Difficulty in walking, not elsewhere classified  Rationale for Evaluation and Treatment: Rehabilitation  ONSET DATE: 11/21/23  SUBJECTIVE:   SUBJECTIVE STATEMENT: Patient reports that she has had issues since a lateral meniscus tear in  2018, she underwent a left TKA on 11/21/23.  She reports doing okay since surgery.  PERTINENT HISTORY: Pre diabetes PAIN:  Are you having pain? Yes: NPRS scale: 4/10 Pain location: left knee Pain description: ache Aggravating factors: getting up from sleep, standing and bending raising leg up, pain is up to 7/10 Relieving factors: pain meds, rest, ice , elevation at best a 2/10  PRECAUTIONS: None  RED FLAGS: None   WEIGHT BEARING RESTRICTIONS: No  FALLS:  Has patient fallen in last 6 months? No  LIVING ENVIRONMENT: Lives with: lives with their family Lives in: House/apartment Stairs: 3 steps to car Has following equipment at home: [name] - 2 wheeled  OCCUPATION: realtor. Stairs, walking  PLOF: Independent and yard work, house work, has a 40 and 51 year old  PATIENT GOALS: walk without any issues kick soccer ball, like to go on a hike. Ride a bike  NEXT MD VISIT: 12/05/23  OBJECTIVE:  Note: Objective measures were completed at Evaluation unless otherwise noted.  DIAGNOSTIC FINDINGS: ne  PATIENT SURVEYS:  LEFS = 8.8%  COGNITION: Overall cognitive status: Within functional limits for tasks assessed     SENSATION: WFL  EDEMA:  Circumferential: right mid patella 38 cm, 44 cm  left mid patella  MUSCLE LENGTH: Tight HS and  calves  POSTURE: rounded shoulders and forward head  PALPATION: Mild tenderness to the left knee, the left quad is extremely tight, tightness in the ITB and the left buttock as well, she could not relax, worried about this being a significant habit that could cause hip/back issues  LOWER EXTREMITY ROM:  Active ROM AROM  LEft eval PROM Left eval  Hip flexion    Hip extension    Hip abduction    Hip adduction    Hip internal rotation    Hip external rotation    Knee flexion 90 96  Knee extension 60 28  Ankle dorsiflexion    Ankle plantarflexion    Ankle inversion    Ankle eversion     (Blank rows = not tested)  LOWER EXTREMITY  MMT:  very poor quads, could not lift leg off the floor, pain in the patella   LOWER EXTREMITY SPECIAL TESTS:  Knee special tests: quad lag  FUNCTIONAL TESTS:  Timed up and go (TUG): 22 seconds with walker  GAIT: Distance walked: 100 feet Assistive device utilized: Walker - 2 wheeled Level of assistance: SBA Comments: Patient does not straighten the leg, tends to not bear full weight on the left leg and uses her arms for most weight bearing, she does stick her bottom out and leans way forward, I feel that she is doing this due to pain and fear of quad activation, as noted above she could not lift the leg due to pain in the patella with quad activation                                                                                                                                TREATMENT DATE:  11/23/23 Evaluation Nustep level 4 x 4 minutes  HEP Education on walking   PATIENT EDUCATION:  Education details: POC/HEP Person educated: Patient Education method: Programmer, multimedia, Demonstration, Actor cues, Verbal cues, and Handouts Education comprehension: verbalized understanding, returned demonstration, verbal cues required, tactile cues required, and needs further education  HOME EXERCISE PROGRAM: Access Code: [email or web address] URL: [email or web address] Date: 11/23/2023 Prepared by: Ozell Mainland  Exercises - Supine Quad Set  - 2 x daily - 7 x weekly - 2 sets - 10 reps - 3 hold - Supine Short Arc Quad  - 2 x daily - 7 x weekly - 2 sets - 10 reps - 3 hold - Supine Heel Slide  - 2 x daily - 7 x weekly - 1 sets - 10 reps - 10 hold - Seated Hamstring Stretch with Chair  - 2 x daily - 7 x weekly - 2 sets - 5 reps - 30 hold  ASSESSMENT:  CLINICAL IMPRESSION: Patient is a 53 y.o. female who was seen today for physical therapy evaluation and treatment for s/p left TKA.  She had surgery 9/2, she is very guarded in the left quad, hip area.  The quad is shut down and she c/o pain an  8/10 with any activation, tends to use hands to raise the leg up.  She has very poor quad extension.  With ambulation she has the knee in flexion and tends to use her arms to bear the weight, she sticks her bottom out and leans far forward to decrease quad activation.   OBJECTIVE IMPAIRMENTS: Abnormal gait, cardiopulmonary status limiting activity, decreased activity tolerance, decreased balance, decreased coordination, decreased endurance, decreased mobility, difficulty walking, decreased ROM, decreased strength, increased edema, increased fascial restrictions, increased muscle spasms, impaired flexibility, improper body mechanics, postural dysfunction, and pain.   REHAB POTENTIAL: Good  CLINICAL DECISION MAKING: Evolving/moderate complexity  EVALUATION COMPLEXITY: Low   GOALS: Goals reviewed with patient? Yes  SHORT TERM GOALS: Target date: 12/14/23 Independent with initial HEP Baseline: Goal status: INITIAL  LONG TERM GOALS: Target date: 02/22/24  Independent with advanced HEP Baseline:  Goal status: INITIAL  2.  Decrease pain overall 50% for higher quality of life Baseline:  Goal status: INITIAL  3.  Increase left knee AROM to 0-120 degrees flexion for increase functional gait and independence Baseline:  Goal status: INITIAL  4.  Decrease TUG time to 11 seconds for functional gait Baseline:  Goal status: INITIAL  5.  Be able to go up and down stairs step over step without device Baseline:  Goal status: INITIAL  6.  Ambulate all distances without device with minimal deviations of gait Baseline:  Goal status: INITIAL   PLAN:  PT FREQUENCY: 2x/week  PT DURATION: 12 weeks  PLANNED INTERVENTIONS: 97164- PT Re-evaluation, 97110-Therapeutic exercises, 97530- Therapeutic activity, 97112- Neuromuscular re-education, 97535- Self Care, 02859- Manual therapy, [phone]- Gait training, [phone]- Vasopneumatic device, Patient/Family education, Balance training, Stair training, Joint  mobilization, and Cryotherapy  PLAN FOR NEXT SESSION: work on quad activation and gait   [employer], PT 11/23/2023, 1:23 PM

## 2023-11-27 ENCOUNTER — Encounter: Payer: Self-pay | Admitting: Physical Therapy

## 2023-11-27 ENCOUNTER — Ambulatory Visit: Admitting: Physical Therapy

## 2023-11-27 DIAGNOSIS — M25562 Pain in left knee: Secondary | ICD-10-CM | POA: Diagnosis not present

## 2023-11-27 DIAGNOSIS — R600 Localized edema: Secondary | ICD-10-CM

## 2023-11-27 DIAGNOSIS — M25662 Stiffness of left knee, not elsewhere classified: Secondary | ICD-10-CM

## 2023-11-27 DIAGNOSIS — R262 Difficulty in walking, not elsewhere classified: Secondary | ICD-10-CM

## 2023-11-27 NOTE — Therapy (Signed)
 OUTPATIENT PHYSICAL THERAPY LOWER EXTREMITY TREATMENT   Patient Name: Tamara Bender MRN: [id number] DOB:[birth year], 53 y.o., female Today's Date: 11/27/2023  END OF SESSION:  PT End of Session - 11/27/23 1259     Visit Number 2    Date for PT Re-Evaluation 02/22/24    PT Start Time 1300    PT Stop Time 1345    PT Time Calculation (min) 45 min    Activity Tolerance Patient tolerated treatment well    Behavior During Therapy WFL for tasks assessed/performed          Past Medical History:  Diagnosis Date   Allergy    Anxiety    Eczema    as a child,- 67 yo -- no current problems   Hypothyroid    PONV (postoperative nausea and vomiting)    Pre-diabetes    no meds - diet controlled   Past Surgical History:  Procedure Laterality Date   CESAREAN SECTION  2012, 2016   x 2   DILATION AND CURETTAGE OF UTERUS     x 2 - MAB   DILATION AND CURETTAGE OF UTERUS     for 3rd miscarriage    DILATION AND EVACUATION N/A 04/18/2017   Procedure: DILATATION AND EVACUATION;  Surgeon: Alger Gong, MD;  Location: [facility];  Service: Gynecology;  Laterality: N/A;   KNEE CARTILAGE SURGERY Bilateral 1998, 2018   1998 left, 2018 right   LAPAROSCOPY  2013   blocked fallopian tube   WISDOM TOOTH EXTRACTION     Patient Active Problem List   Diagnosis Date Noted   Missed abortion    Mild persistent asthma without complication 01/05/2016   Other allergic rhinitis 01/05/2016   Acute non-recurrent maxillary sinusitis 01/05/2016    PCP: Andrew, MD  REFERRING PROVIDER: Aluisio, MD  REFERRING DIAG: s/p left TKA  THERAPY DIAG:  Acute pain of left knee  Stiffness of left knee, not elsewhere classified  Localized edema  Difficulty in walking, not elsewhere classified  Rationale for Evaluation and Treatment: Rehabilitation  ONSET DATE: 11/21/23  SUBJECTIVE:   SUBJECTIVE STATEMENT:  Forgot her walker. Its wired something is changing.    Patient reports that she has had issues  since a lateral meniscus tear in 2018, she underwent a left TKA on 11/21/23.  She reports doing okay since surgery.  PERTINENT HISTORY: Pre diabetes PAIN:  Are you having pain? Yes: NPRS scale: 0/10 Pain location: left knee Pain description: ache Aggravating factors: getting up from sleep, standing and bending raising leg up, pain is up to 7/10 Relieving factors: pain meds, rest, ice , elevation at best a 2/10  PRECAUTIONS: None  RED FLAGS: None   WEIGHT BEARING RESTRICTIONS: No  FALLS:  Has patient fallen in last 6 months? No  LIVING ENVIRONMENT: Lives with: lives with their family Lives in: House/apartment Stairs: 3 steps to car Has following equipment at home: [name] - 2 wheeled  OCCUPATION: realtor. Stairs, walking  PLOF: Independent and yard work, house work, has a 24 and 51 year old  PATIENT GOALS: walk without any issues kick soccer ball, like to go on a hike. Ride a bike  NEXT MD VISIT: 12/05/23  OBJECTIVE:  Note: Objective measures were completed at Evaluation unless otherwise noted.  DIAGNOSTIC FINDINGS: ne  PATIENT SURVEYS:  LEFS = 8.8%  COGNITION: Overall cognitive status: Within functional limits for tasks assessed     SENSATION: WFL  EDEMA:  Circumferential: right mid patella 38 cm, 44 cm  left mid patella  MUSCLE LENGTH: Tight HS and calves  POSTURE: rounded shoulders and forward head  PALPATION: Mild tenderness to the left knee, the left quad is extremely tight, tightness in the ITB and the left buttock as well, she could not relax, worried about this being a significant habit that could cause hip/back issues  LOWER EXTREMITY ROM:  Active ROM AROM  LEft eval PROM Left eval AROM 11/27/23  Hip flexion     Hip extension     Hip abduction     Hip adduction     Hip internal rotation     Hip external rotation     Knee flexion 90 96 92  Knee extension 60 28 19  Ankle dorsiflexion     Ankle plantarflexion     Ankle inversion     Ankle  eversion      (Blank rows = not tested)  LOWER EXTREMITY MMT:  very poor quads, could not lift leg off the floor, pain in the patella   LOWER EXTREMITY SPECIAL TESTS:  Knee special tests: quad lag  FUNCTIONAL TESTS:  Timed up and go (TUG): 22 seconds with walker  GAIT: Distance walked: 100 feet Assistive device utilized: Walker - 2 wheeled Level of assistance: SBA Comments: Patient does not straighten the leg, tends to not bear full weight on the left leg and uses her arms for most weight bearing, she does stick her bottom out and leans way forward, I feel that she is doing this due to pain and fear of quad activation, as noted above she could not lift the leg due to pain in the patella with quad activation                                                                                                                                TREATMENT DATE:  11/27/23 NuStep  L4 x 6 min L knee PROM w/ end range holds   L knee patella mobs  L knee quad sets x10 LLE LAQ 2lb 2x10 LLE HS curls red 2x10   Sit to stand 2x10 Standing march 2x10    11/23/23 Evaluation Nustep level 4 x 4 minutes  HEP Education on walking   PATIENT EDUCATION:  Education details: POC/HEP Person educated: Patient Education method: Programmer, multimedia, Facilities manager, Actor cues, Verbal cues, and Handouts Education comprehension: verbalized understanding, returned demonstration, verbal cues required, tactile cues required, and needs further education  HOME EXERCISE PROGRAM: Access Code: [email or web address] URL: [email or web address] Date: 11/23/2023 Prepared by: Ozell Mainland  Exercises - Supine Quad Set  - 2 x daily - 7 x weekly - 2 sets - 10 reps - 3 hold - Supine Short Arc Quad  - 2 x daily - 7 x weekly - 2 sets - 10 reps - 3 hold - Supine Heel Slide  - 2 x daily - 7 x weekly - 1 sets - 10 reps - 10 hold - Seated Hamstring Stretch with Chair  - 2  x daily - 7 x weekly - 2 sets - 5 reps - 30  hold  ASSESSMENT:  CLINICAL IMPRESSION: Patient is a 53 y.o. female who was seen today for physical therapy treatment for s/p left TKA.  She had surgery 9/2, she is very guarded in the left quad, hip area.  She has very poor quad extension.  With ambulation she has the knee in flexion with a forward flexed posture due to decrease TKE. Soft end feel with L knee flexion, pain being the limiting factor.  Cues needed to hold contraction with curls and extensions. Cues also needed to use LLE, and not tto assist with UE.   OBJECTIVE IMPAIRMENTS: Abnormal gait, cardiopulmonary status limiting activity, decreased activity tolerance, decreased balance, decreased coordination, decreased endurance, decreased mobility, difficulty walking, decreased ROM, decreased strength, increased edema, increased fascial restrictions, increased muscle spasms, impaired flexibility, improper body mechanics, postural dysfunction, and pain.   REHAB POTENTIAL: Good  CLINICAL DECISION MAKING: Evolving/moderate complexity  EVALUATION COMPLEXITY: Low   GOALS: Goals reviewed with patient? Yes  SHORT TERM GOALS: Target date: 12/14/23 Independent with initial HEP Baseline: Goal status: Met 11/27/23  LONG TERM GOALS: Target date: 02/22/24  Independent with advanced HEP Baseline:  Goal status: INITIAL  2.  Decrease pain overall 50% for higher quality of life Baseline:  Goal status: INITIAL  3.  Increase left knee AROM to 0-120 degrees flexion for increase functional gait and independence Baseline:  Goal status: INITIAL  4.  Decrease TUG time to 11 seconds for functional gait Baseline:  Goal status: INITIAL  5.  Be able to go up and down stairs step over step without device Baseline:  Goal status: INITIAL  6.  Ambulate all distances without device with minimal deviations of gait Baseline:  Goal status: INITIAL   PLAN:  PT FREQUENCY: 2x/week  PT DURATION: 12 weeks  PLANNED INTERVENTIONS: 97164- PT  Re-evaluation, 97110-Therapeutic exercises, 97530- Therapeutic activity, 97112- Neuromuscular re-education, 97535- Self Care, 02859- Manual therapy, [phone]- Gait training, [phone]- Vasopneumatic device, Patient/Family education, Balance training, Stair training, Joint mobilization, and Cryotherapy  PLAN FOR NEXT SESSION: work on quad activation and gait   Tanda KANDICE Sorrow, PTA 11/27/2023, 1:00 PM

## 2023-11-30 ENCOUNTER — Encounter: Payer: Self-pay | Admitting: Physical Therapy

## 2023-11-30 ENCOUNTER — Ambulatory Visit: Admitting: Physical Therapy

## 2023-11-30 DIAGNOSIS — R262 Difficulty in walking, not elsewhere classified: Secondary | ICD-10-CM

## 2023-11-30 DIAGNOSIS — M25562 Pain in left knee: Secondary | ICD-10-CM

## 2023-11-30 DIAGNOSIS — M25662 Stiffness of left knee, not elsewhere classified: Secondary | ICD-10-CM

## 2023-11-30 DIAGNOSIS — R600 Localized edema: Secondary | ICD-10-CM

## 2023-11-30 NOTE — Therapy (Signed)
 OUTPATIENT PHYSICAL THERAPY LOWER EXTREMITY TREATMENT   Patient Name: Tamara Bender MRN: [id number] DOB:Aug 27, 1970, 53 y.o., female Today's Date: 11/30/2023  END OF SESSION:  PT End of Session - 11/30/23 1314     Visit Number 3    Date for PT Re-Evaluation 02/22/24    Authorization Type [employer]    PT Start Time 1315    PT Stop Time 1400    PT Time Calculation (min) 45 min    Activity Tolerance Patient tolerated treatment well    Behavior During Therapy WFL for tasks assessed/performed          Past Medical History:  Diagnosis Date   Allergy    Anxiety    Eczema    as a child,- 89 yo -- no current problems   Hypothyroid    PONV (postoperative nausea and vomiting)    Pre-diabetes    no meds - diet controlled   Past Surgical History:  Procedure Laterality Date   CESAREAN SECTION  2012, 2016   x 2   DILATION AND CURETTAGE OF UTERUS     x 2 - MAB   DILATION AND CURETTAGE OF UTERUS     for 3rd miscarriage    DILATION AND EVACUATION N/A 04/18/2017   Procedure: DILATATION AND EVACUATION;  Surgeon: Alger Gong, MD;  Location: [facility];  Service: Gynecology;  Laterality: N/A;   KNEE CARTILAGE SURGERY Bilateral 1998, 2018   1998 left, 2018 right   LAPAROSCOPY  2013   blocked fallopian tube   WISDOM TOOTH EXTRACTION     Patient Active Problem List   Diagnosis Date Noted   Missed abortion    Mild persistent asthma without complication 01/05/2016   Other allergic rhinitis 01/05/2016   Acute non-recurrent maxillary sinusitis 01/05/2016    PCP: Andrew, MD  REFERRING PROVIDER: Melodi, MD  REFERRING DIAG: s/p left TKA  THERAPY DIAG:  Acute pain of left knee  Stiffness of left knee, not elsewhere classified  Localized edema  Difficulty in walking, not elsewhere classified  Rationale for Evaluation and Treatment: Rehabilitation  ONSET DATE: 11/21/23  SUBJECTIVE:   SUBJECTIVE STATEMENT:  I was doing okay and then I started hurting a few hours ago   Patient  reports that she has had issues since a lateral meniscus tear in 2018, she underwent a left TKA on 11/21/23.  She reports doing okay since surgery.  PERTINENT HISTORY: Pre diabetes PAIN:  Are you having pain? Yes: NPRS scale: 0/10 Pain location: left knee Pain description: ache Aggravating factors: getting up from sleep, standing and bending raising leg up, pain is up to 7/10 Relieving factors: pain meds, rest, ice , elevation at best a 2/10  PRECAUTIONS: None  RED FLAGS: None   WEIGHT BEARING RESTRICTIONS: No  FALLS:  Has patient fallen in last 6 months? No  LIVING ENVIRONMENT: Lives with: lives with their family Lives in: House/apartment Stairs: 3 steps to car Has following equipment at home: [name] - 2 wheeled  OCCUPATION: realtor. Stairs, walking  PLOF: Independent and yard work, house work, has a 33 and 51 year old  PATIENT GOALS: walk without any issues kick soccer ball, like to go on a hike. Ride a bike  NEXT MD VISIT: 12/05/23  OBJECTIVE:  Note: Objective measures were completed at Evaluation unless otherwise noted.  DIAGNOSTIC FINDINGS: ne  PATIENT SURVEYS:  LEFS = 8.8%  COGNITION: Overall cognitive status: Within functional limits for tasks assessed     SENSATION: WFL  EDEMA:  Circumferential: right  mid patella 38 cm, 44 cm  left mid patella  MUSCLE LENGTH: Tight HS and calves  POSTURE: rounded shoulders and forward head  PALPATION: Mild tenderness to the left knee, the left quad is extremely tight, tightness in the ITB and the left buttock as well, she could not relax, worried about this being a significant habit that could cause hip/back issues  LOWER EXTREMITY ROM:  Active ROM AROM  LEft eval PROM Left eval AROM 11/27/23  Hip flexion     Hip extension     Hip abduction     Hip adduction     Hip internal rotation     Hip external rotation     Knee flexion 90 96 92  Knee extension 60 28 19  Ankle dorsiflexion     Ankle plantarflexion      Ankle inversion     Ankle eversion      (Blank rows = not tested)  LOWER EXTREMITY MMT:  very poor quads, could not lift leg off the floor, pain in the patella   LOWER EXTREMITY SPECIAL TESTS:  Knee special tests: quad lag  FUNCTIONAL TESTS:  Timed up and go (TUG): 22 seconds with walker  GAIT: Distance walked: 100 feet Assistive device utilized: Walker - 2 wheeled Level of assistance: SBA Comments: Patient does not straighten the leg, tends to not bear full weight on the left leg and uses her arms for most weight bearing, she does stick her bottom out and leans way forward, I feel that she is doing this due to pain and fear of quad activation, as noted above she could not lift the leg due to pain in the patella with quad activation                                                                                                                                TREATMENT DATE:  11/30/23 Nustep level 4 x 6 minutes 15# HS curls Back to wall TKE with ball behind knee and with red tband Feet on ball K2C, rotation, small bridge, isometric abs SAQ cues verbal and tactile Passive stretch flexion and extension, some joint mobs and some joint distraction Worked on gait, decreasing trunk lean laterally and trunk flexion, trying to get better toe off and a natural knee flexion without device  11/27/23 NuStep  L4 x 6 min L knee PROM w/ end range holds   L knee patella mobs  L knee quad sets x10 LLE LAQ 2lb 2x10 LLE HS curls red 2x10   Sit to stand 2x10 Standing march 2x10    11/23/23 Evaluation Nustep level 4 x 4 minutes  HEP Education on walking   PATIENT EDUCATION:  Education details: POC/HEP Person educated: Patient Education method: Programmer, multimedia, Facilities manager, Actor cues, Verbal cues, and Handouts Education comprehension: verbalized understanding, returned demonstration, verbal cues required, tactile cues required, and needs further education  HOME EXERCISE PROGRAM: Access  Code: [email or web address] URL: [email or web address]  Date: 11/23/2023 Prepared by: Ozell Mainland  Exercises - Supine Quad Set  - 2 x daily - 7 x weekly - 2 sets - 10 reps - 3 hold - Supine Short Arc Quad  - 2 x daily - 7 x weekly - 2 sets - 10 reps - 3 hold - Supine Heel Slide  - 2 x daily - 7 x weekly - 1 sets - 10 reps - 10 hold - Seated Hamstring Stretch with Chair  - 2 x daily - 7 x weekly - 2 sets - 5 reps - 30 hold  ASSESSMENT:  CLINICAL IMPRESSION: Introduced HS curls on machine and worked more on TKE/VMO in standing and in supine,  She was much better today with her ability to activate the quads and seems to have a little less tightness in the quad.  Really worked on her gait pattern today as she tends to use the hip and the back.  Patient is a 53 y.o. female who was seen today for physical therapy treatment for s/p left TKA.  She had surgery 9/2, she is very guarded in the left quad, hip area.  She has very poor quad extension.  With ambulation she has the knee in flexion with a forward flexed posture due to decrease TKE.   OBJECTIVE IMPAIRMENTS: Abnormal gait, cardiopulmonary status limiting activity, decreased activity tolerance, decreased balance, decreased coordination, decreased endurance, decreased mobility, difficulty walking, decreased ROM, decreased strength, increased edema, increased fascial restrictions, increased muscle spasms, impaired flexibility, improper body mechanics, postural dysfunction, and pain.   REHAB POTENTIAL: Good  CLINICAL DECISION MAKING: Evolving/moderate complexity  EVALUATION COMPLEXITY: Low   GOALS: Goals reviewed with patient? Yes  SHORT TERM GOALS: Target date: 12/14/23 Independent with initial HEP Baseline: Goal status: Met 11/27/23  LONG TERM GOALS: Target date: 02/22/24  Independent with advanced HEP Baseline:  Goal status: INITIAL  2.  Decrease pain overall 50% for higher quality of life Baseline:  Goal status:  INITIAL  3.  Increase left knee AROM to 0-120 degrees flexion for increase functional gait and independence Baseline:  Goal status: INITIAL  4.  Decrease TUG time to 11 seconds for functional gait Baseline:  Goal status: INITIAL  5.  Be able to go up and down stairs step over step without device Baseline:  Goal status: INITIAL  6.  Ambulate all distances without device with minimal deviations of gait Baseline:  Goal status: INITIAL   PLAN:  PT FREQUENCY: 2x/week  PT DURATION: 12 weeks  PLANNED INTERVENTIONS: 97164- PT Re-evaluation, 97110-Therapeutic exercises, 97530- Therapeutic activity, 97112- Neuromuscular re-education, 97535- Self Care, [id number]- Manual therapy, [phone]- Gait training, [phone]- Vasopneumatic device, Patient/Family education, Balance training, Stair training, Joint mobilization, and Cryotherapy  PLAN FOR NEXT SESSION: work on quad activation and gait   [employer], PT 11/30/2023, 1:19 PM

## 2023-12-04 ENCOUNTER — Encounter: Payer: Self-pay | Admitting: Physical Therapy

## 2023-12-04 ENCOUNTER — Ambulatory Visit: Admitting: Physical Therapy

## 2023-12-04 DIAGNOSIS — R262 Difficulty in walking, not elsewhere classified: Secondary | ICD-10-CM

## 2023-12-04 DIAGNOSIS — M25562 Pain in left knee: Secondary | ICD-10-CM | POA: Diagnosis not present

## 2023-12-04 DIAGNOSIS — M25662 Stiffness of left knee, not elsewhere classified: Secondary | ICD-10-CM

## 2023-12-04 DIAGNOSIS — R600 Localized edema: Secondary | ICD-10-CM

## 2023-12-04 NOTE — Therapy (Signed)
 OUTPATIENT PHYSICAL THERAPY LOWER EXTREMITY TREATMENT   Patient Name: Tamara Bender MRN: [id number] DOB:May 03, 1970, 53 y.o., female Today's Date: 12/04/2023  END OF SESSION:  PT End of Session - 12/04/23 1301     Visit Number 4    Date for PT Re-Evaluation 02/22/24    PT Start Time 1300    PT Stop Time 1345    PT Time Calculation (min) 45 min    Activity Tolerance Patient tolerated treatment well    Behavior During Therapy WFL for tasks assessed/performed          Past Medical History:  Diagnosis Date   Allergy    Anxiety    Eczema    as a child,- 77 yo -- no current problems   Hypothyroid    PONV (postoperative nausea and vomiting)    Pre-diabetes    no meds - diet controlled   Past Surgical History:  Procedure Laterality Date   CESAREAN SECTION  2012, 2016   x 2   DILATION AND CURETTAGE OF UTERUS     x 2 - MAB   DILATION AND CURETTAGE OF UTERUS     for 3rd miscarriage    DILATION AND EVACUATION N/A 04/18/2017   Procedure: DILATATION AND EVACUATION;  Surgeon: Alger Gong, MD;  Location: [facility];  Service: Gynecology;  Laterality: N/A;   KNEE CARTILAGE SURGERY Bilateral 1998, 2018   1998 left, 2018 right   LAPAROSCOPY  2013   blocked fallopian tube   WISDOM TOOTH EXTRACTION     Patient Active Problem List   Diagnosis Date Noted   Missed abortion    Mild persistent asthma without complication 01/05/2016   Other allergic rhinitis 01/05/2016   Acute non-recurrent maxillary sinusitis 01/05/2016    PCP: Andrew, MD  REFERRING PROVIDER: Melodi, MD  REFERRING DIAG: s/p left TKA  THERAPY DIAG:  Stiffness of left knee, not elsewhere classified  Localized edema  Difficulty in walking, not elsewhere classified  Acute pain of left knee  Rationale for Evaluation and Treatment: Rehabilitation  ONSET DATE: 11/21/23  SUBJECTIVE:   SUBJECTIVE STATEMENT: Been trying to reduce meds to be able to drive but has increase pain.    Patient reports that she  has had issues since a lateral meniscus tear in 2018, she underwent a left TKA on 11/21/23.  She reports doing okay since surgery.  PERTINENT HISTORY: Pre diabetes PAIN:  Are you having pain? Yes: NPRS scale: 3/10 Pain location: left knee Pain description: ache Aggravating factors: getting up from sleep, standing and bending raising leg up, pain is up to 7/10 Relieving factors: pain meds, rest, ice , elevation at best a 2/10  PRECAUTIONS: None  RED FLAGS: None   WEIGHT BEARING RESTRICTIONS: No  FALLS:  Has patient fallen in last 6 months? No  LIVING ENVIRONMENT: Lives with: lives with their family Lives in: House/apartment Stairs: 3 steps to car Has following equipment at home: [name] - 2 wheeled  OCCUPATION: realtor. Stairs, walking  PLOF: Independent and yard work, house work, has a 23 and 51 year old  PATIENT GOALS: walk without any issues kick soccer ball, like to go on a hike. Ride a bike  NEXT MD VISIT: 12/05/23  OBJECTIVE:  Note: Objective measures were completed at Evaluation unless otherwise noted.  DIAGNOSTIC FINDINGS: ne  PATIENT SURVEYS:  LEFS = 8.8%  COGNITION: Overall cognitive status: Within functional limits for tasks assessed     SENSATION: WFL  EDEMA:  Circumferential: right mid patella 38 cm, 44  cm  left mid patella  MUSCLE LENGTH: Tight HS and calves  POSTURE: rounded shoulders and forward head  PALPATION: Mild tenderness to the left knee, the left quad is extremely tight, tightness in the ITB and the left buttock as well, she could not relax, worried about this being a significant habit that could cause hip/back issues  LOWER EXTREMITY ROM:  Active ROM AROM  LEft eval PROM Left eval AROM 11/27/23  Hip flexion     Hip extension     Hip abduction     Hip adduction     Hip internal rotation     Hip external rotation     Knee flexion 90 96 92  Knee extension 60 28 19  Ankle dorsiflexion     Ankle plantarflexion     Ankle  inversion     Ankle eversion      (Blank rows = not tested)  LOWER EXTREMITY MMT:  very poor quads, could not lift leg off the floor, pain in the patella   LOWER EXTREMITY SPECIAL TESTS:  Knee special tests: quad lag  FUNCTIONAL TESTS:  Timed up and go (TUG): 22 seconds with walker  GAIT: Distance walked: 100 feet Assistive device utilized: Walker - 2 wheeled Level of assistance: SBA Comments: Patient does not straighten the leg, tends to not bear full weight on the left leg and uses her arms for most weight bearing, she does stick her bottom out and leans way forward, I feel that she is doing this due to pain and fear of quad activation, as noted above she could not lift the leg due to pain in the patella with quad activation                                                                                                                                TREATMENT DATE:  12/04/23 NuStep L 5 x 6 min L knee PROM w/ end range holds   L knee patella mobs  Sit to stand 2x10 LAQ RLE 5lb 2x10 HS curls green 2x10 Heel raises 2x15 Leg press 40lb 2x10   11/30/23 Nustep level 4 x 6 minutes 15# HS curls Back to wall TKE with ball behind knee and with red tband Feet on ball K2C, rotation, small bridge, isometric abs SAQ cues verbal and tactile Passive stretch flexion and extension, some joint mobs and some joint distraction Worked on gait, decreasing trunk lean laterally and trunk flexion, trying to get better toe off and a natural knee flexion without device  11/27/23 NuStep  L4 x 6 min L knee PROM w/ end range holds   L knee patella mobs  L knee quad sets x10 LLE LAQ 2lb 2x10 LLE HS curls red 2x10   Sit to stand 2x10 Standing march 2x10    11/23/23 Evaluation Nustep level 4 x 4 minutes  HEP Education on walking   PATIENT EDUCATION:  Education details: POC/HEP Person educated:  Patient Education method: Explanation, Demonstration, Tactile cues, Verbal cues, and  Handouts Education comprehension: verbalized understanding, returned demonstration, verbal cues required, tactile cues required, and needs further education  HOME EXERCISE PROGRAM: Access Code: [email or web address] URL: [email or web address] Date: 11/23/2023 Prepared by: Ozell Mainland  Exercises - Supine Quad Set  - 2 x daily - 7 x weekly - 2 sets - 10 reps - 3 hold - Supine Short Arc Quad  - 2 x daily - 7 x weekly - 2 sets - 10 reps - 3 hold - Supine Heel Slide  - 2 x daily - 7 x weekly - 1 sets - 10 reps - 10 hold - Seated Hamstring Stretch with Chair  - 2 x daily - 7 x weekly - 2 sets - 5 reps - 30 hold  ASSESSMENT:  CLINICAL IMPRESSION: Pt enters ambulating without AD. She has a forward trunk lean due to decrease L knee TKE. Pain reported with PROM at end range. Cue needed to hold contraction with LAQ. Postural cue needed with step up to prevent excessive trunk. Gait did improve post session.   Patient is a 53 y.o. female who was seen today for physical therapy treatment for s/p left TKA.  She had surgery 9/2, she is very guarded in the left quad, hip area.  She has very poor quad extension.  With ambulation she has the knee in flexion with a forward flexed posture due to decrease TKE.   OBJECTIVE IMPAIRMENTS: Abnormal gait, cardiopulmonary status limiting activity, decreased activity tolerance, decreased balance, decreased coordination, decreased endurance, decreased mobility, difficulty walking, decreased ROM, decreased strength, increased edema, increased fascial restrictions, increased muscle spasms, impaired flexibility, improper body mechanics, postural dysfunction, and pain.   REHAB POTENTIAL: Good  CLINICAL DECISION MAKING: Evolving/moderate complexity  EVALUATION COMPLEXITY: Low   GOALS: Goals reviewed with patient? Yes  SHORT TERM GOALS: Target date: 12/14/23 Independent with initial HEP Baseline: Goal status: Met 11/27/23  LONG TERM GOALS: Target date:  02/22/24  Independent with advanced HEP Baseline:  Goal status: INITIAL  2.  Decrease pain overall 50% for higher quality of life Baseline:  Goal status: INITIAL  3.  Increase left knee AROM to 0-120 degrees flexion for increase functional gait and independence Baseline:  Goal status: INITIAL  4.  Decrease TUG time to 11 seconds for functional gait Baseline:  Goal status: INITIAL  5.  Be able to go up and down stairs step over step without device Baseline:  Goal status: INITIAL  6.  Ambulate all distances without device with minimal deviations of gait Baseline:  Goal status: INITIAL   PLAN:  PT FREQUENCY: 2x/week  PT DURATION: 12 weeks  PLANNED INTERVENTIONS: 97164- PT Re-evaluation, 97110-Therapeutic exercises, 97530- Therapeutic activity, 97112- Neuromuscular re-education, 97535- Self Care, 02859- Manual therapy, [phone]- Gait training, [phone]- Vasopneumatic device, Patient/Family education, Balance training, Stair training, Joint mobilization, and Cryotherapy  PLAN FOR NEXT SESSION: work on quad activation and gait   Tanda KANDICE Sorrow, PTA 12/04/2023, 1:02 PM

## 2023-12-07 ENCOUNTER — Ambulatory Visit: Admitting: Physical Therapy

## 2023-12-07 ENCOUNTER — Encounter: Payer: Self-pay | Admitting: Physical Therapy

## 2023-12-07 DIAGNOSIS — M25662 Stiffness of left knee, not elsewhere classified: Secondary | ICD-10-CM

## 2023-12-07 DIAGNOSIS — R262 Difficulty in walking, not elsewhere classified: Secondary | ICD-10-CM

## 2023-12-07 DIAGNOSIS — M25562 Pain in left knee: Secondary | ICD-10-CM

## 2023-12-07 DIAGNOSIS — R600 Localized edema: Secondary | ICD-10-CM

## 2023-12-07 NOTE — Therapy (Signed)
 OUTPATIENT PHYSICAL THERAPY LOWER EXTREMITY TREATMENT   Patient Name: Tamara Bender MRN: [id number] DOB:Sep 24, 1970, 53 y.o., female Today's Date: 12/07/2023  END OF SESSION:  PT End of Session - 12/07/23 1305     Visit Number 5    Date for Recertification  02/22/24    PT Start Time 1305    PT Stop Time 1345    PT Time Calculation (min) 40 min    Activity Tolerance Patient tolerated treatment well    Behavior During Therapy WFL for tasks assessed/performed          Past Medical History:  Diagnosis Date   Allergy    Anxiety    Eczema    as a child,- 78 yo -- no current problems   Hypothyroid    PONV (postoperative nausea and vomiting)    Pre-diabetes    no meds - diet controlled   Past Surgical History:  Procedure Laterality Date   CESAREAN SECTION  2012, 2016   x 2   DILATION AND CURETTAGE OF UTERUS     x 2 - MAB   DILATION AND CURETTAGE OF UTERUS     for 3rd miscarriage    DILATION AND EVACUATION N/A 04/18/2017   Procedure: DILATATION AND EVACUATION;  Surgeon: Alger Gong, MD;  Location: [facility];  Service: Gynecology;  Laterality: N/A;   KNEE CARTILAGE SURGERY Bilateral 1998, 2018   1998 left, 2018 right   LAPAROSCOPY  2013   blocked fallopian tube   WISDOM TOOTH EXTRACTION     Patient Active Problem List   Diagnosis Date Noted   Missed abortion    Mild persistent asthma without complication 01/05/2016   Other allergic rhinitis 01/05/2016   Acute non-recurrent maxillary sinusitis 01/05/2016    PCP: Andrew, MD  REFERRING PROVIDER: Melodi, MD  REFERRING DIAG: s/p left TKA  THERAPY DIAG:  Stiffness of left knee, not elsewhere classified  Localized edema  Difficulty in walking, not elsewhere classified  Acute pain of left knee  Rationale for Evaluation and Treatment: Rehabilitation  ONSET DATE: 11/21/23  SUBJECTIVE:   SUBJECTIVE STATEMENT: Seen the PA yesterday and she was pleased, needs to focus more on Ext   Patient reports that she  has had issues since a lateral meniscus tear in 2018, she underwent a left TKA on 11/21/23.  She reports doing okay since surgery.  PERTINENT HISTORY: Pre diabetes PAIN:  Are you having pain? Yes: NPRS scale: 3/10 Pain location: left knee Pain description: ache Aggravating factors: getting up from sleep, standing and bending raising leg up, pain is up to 7/10 Relieving factors: pain meds, rest, ice , elevation at best a 2/10  PRECAUTIONS: None  RED FLAGS: None   WEIGHT BEARING RESTRICTIONS: No  FALLS:  Has patient fallen in last 6 months? No  LIVING ENVIRONMENT: Lives with: lives with their family Lives in: House/apartment Stairs: 3 steps to car Has following equipment at home: [name] - 2 wheeled  OCCUPATION: realtor. Stairs, walking  PLOF: Independent and yard work, house work, has a 48 and 51 year old  PATIENT GOALS: walk without any issues kick soccer ball, like to go on a hike. Ride a bike  NEXT MD VISIT: 12/05/23  OBJECTIVE:  Note: Objective measures were completed at Evaluation unless otherwise noted.  DIAGNOSTIC FINDINGS: ne  PATIENT SURVEYS:  LEFS = 8.8%  COGNITION: Overall cognitive status: Within functional limits for tasks assessed     SENSATION: WFL  EDEMA:  Circumferential: right mid patella 38 cm, 44 cm  left mid patella  MUSCLE LENGTH: Tight HS and calves  POSTURE: rounded shoulders and forward head  PALPATION: Mild tenderness to the left knee, the left quad is extremely tight, tightness in the ITB and the left buttock as well, she could not relax, worried about this being a significant habit that could cause hip/back issues  LOWER EXTREMITY ROM:  Active ROM AROM  LEft eval PROM Left eval AROM 11/27/23 AROM Left 12/07/23  Hip flexion      Hip extension      Hip abduction      Hip adduction      Hip internal rotation      Hip external rotation      Knee flexion 90 96 92 92  Knee extension 60 28 19 13   Ankle dorsiflexion      Ankle  plantarflexion      Ankle inversion      Ankle eversion       (Blank rows = not tested)  LOWER EXTREMITY MMT:  very poor quads, could not lift leg off the floor, pain in the patella   LOWER EXTREMITY SPECIAL TESTS:  Knee special tests: quad lag  FUNCTIONAL TESTS:  Timed up and go (TUG): 22 seconds with walker  GAIT: Distance walked: 100 feet Assistive device utilized: Environmental consultant - 2 wheeled Level of assistance: SBA Comments: Patient does not straighten the leg, tends to not bear full weight on the left leg and uses her arms for most weight bearing, she does stick her bottom out and leans way forward, I feel that she is doing this due to pain and fear of quad activation, as noted above she could not lift the leg due to pain in the patella with quad activation                                                                                                                                TREATMENT DATE:  12/07/23 L knee PROM w/ end range holds   L knee patella mobs  Leg press 40lb 2x10, LLE 20lb 2x5  LAQ 3lb 2x10 HS curls green 2x10 Sit to stands holding yellow ball 6in step up s  12/04/23 NuStep L 5 x 6 min L knee PROM w/ end range holds   L knee patella mobs  Sit to stand 2x10 LAQ RLE 5lb 2x10 HS curls green 2x10 Heel raises 2x15 Leg press 40lb 2x10   11/30/23 Nustep level 4 x 6 minutes 15# HS curls Back to wall TKE with ball behind knee and with red tband Feet on ball K2C, rotation, small bridge, isometric abs SAQ cues verbal and tactile Passive stretch flexion and extension, some joint mobs and some joint distraction Worked on gait, decreasing trunk lean laterally and trunk flexion, trying to get better toe off and a natural knee flexion without device  11/27/23 NuStep  L4 x 6 min L knee PROM w/ end range holds  L knee patella mobs  L knee quad sets x10 LLE LAQ 2lb 2x10 LLE HS curls red 2x10   Sit to stand 2x10 Standing march 2x10    11/23/23 Evaluation Nustep  level 4 x 4 minutes  HEP Education on walking   PATIENT EDUCATION:  Education details: POC/HEP Person educated: Patient Education method: Programmer, multimedia, Facilities manager, Actor cues, Verbal cues, and Handouts Education comprehension: verbalized understanding, returned demonstration, verbal cues required, tactile cues required, and needs further education  HOME EXERCISE PROGRAM: Access Code: [email or web address] URL: [email or web address] Date: 11/23/2023 Prepared by: Ozell Mainland  Exercises - Supine Quad Set  - 2 x daily - 7 x weekly - 2 sets - 10 reps - 3 hold - Supine Short Arc Quad  - 2 x daily - 7 x weekly - 2 sets - 10 reps - 3 hold - Supine Heel Slide  - 2 x daily - 7 x weekly - 1 sets - 10 reps - 10 hold - Seated Hamstring Stretch with Chair  - 2 x daily - 7 x weekly - 2 sets - 5 reps - 30 hold  ASSESSMENT:  CLINICAL IMPRESSION: Pt enters ambulating without with SPC.  She has a forward trunk lean due to decrease L knee TKE. Pain reported with PROM at end range. Seen PA yesterday asd she was pleased. Mote focus place on Ext with MT. Cue needed to hold contraction with LAQ. Postural cue needed with step up to prevent excessive trunk lean.   Patient is a 53 y.o. female who was seen today for physical therapy treatment for s/p left TKA.  She had surgery 9/2, she is very guarded in the left quad, hip area.  She has very poor quad extension.  With ambulation she has the knee in flexion with a forward flexed posture due to decrease TKE.   OBJECTIVE IMPAIRMENTS: Abnormal gait, cardiopulmonary status limiting activity, decreased activity tolerance, decreased balance, decreased coordination, decreased endurance, decreased mobility, difficulty walking, decreased ROM, decreased strength, increased edema, increased fascial restrictions, increased muscle spasms, impaired flexibility, improper body mechanics, postural dysfunction, and pain.   REHAB POTENTIAL: Good  CLINICAL DECISION  MAKING: Evolving/moderate complexity  EVALUATION COMPLEXITY: Low   GOALS: Goals reviewed with patient? Yes  SHORT TERM GOALS: Target date: 12/14/23 Independent with initial HEP Baseline: Goal status: Met 11/27/23  LONG TERM GOALS: Target date: 02/22/24  Independent with advanced HEP Baseline:  Goal status: INITIAL  2.  Decrease pain overall 50% for higher quality of life Baseline:  Goal status: INITIAL  3.  Increase left knee AROM to 0-120 degrees flexion for increase functional gait and independence Baseline:  Goal status: ongoing 12/07/23  4.  Decrease TUG time to 11 seconds for functional gait Baseline:  Goal status: INITIAL  5.  Be able to go up and down stairs step over step without device Baseline:  Goal status: INITIAL  6.  Ambulate all distances without device with minimal deviations of gait Baseline:  Goal status: INITIAL   PLAN:  PT FREQUENCY: 2x/week  PT DURATION: 12 weeks  PLANNED INTERVENTIONS: 97164- PT Re-evaluation, 97110-Therapeutic exercises, 97530- Therapeutic activity, 97112- Neuromuscular re-education, 97535- Self Care, [id number]- Manual therapy, [phone]- Gait training, [phone]- Vasopneumatic device, Patient/Family education, Balance training, Stair training, Joint mobilization, and Cryotherapy  PLAN FOR NEXT SESSION: work on quad activation and gait   Tanda KANDICE Sorrow, PTA 12/07/2023, 1:06 PM

## 2023-12-11 ENCOUNTER — Ambulatory Visit: Admitting: Physical Therapy

## 2023-12-11 ENCOUNTER — Encounter: Payer: Self-pay | Admitting: Physical Therapy

## 2023-12-11 DIAGNOSIS — M25662 Stiffness of left knee, not elsewhere classified: Secondary | ICD-10-CM

## 2023-12-11 DIAGNOSIS — R262 Difficulty in walking, not elsewhere classified: Secondary | ICD-10-CM

## 2023-12-11 DIAGNOSIS — R600 Localized edema: Secondary | ICD-10-CM

## 2023-12-11 DIAGNOSIS — M25562 Pain in left knee: Secondary | ICD-10-CM

## 2023-12-11 NOTE — Therapy (Signed)
 OUTPATIENT PHYSICAL THERAPY LOWER EXTREMITY TREATMENT   Patient Name: Tamara Bender MRN: [id number] DOB:02-Mar-1971, 53 y.o., female Today's Date: 12/11/2023  END OF SESSION:  PT End of Session - 12/11/23 1357     Visit Number 6    Date for Recertification  02/22/24    Authorization Type [employer]    PT Start Time 1357    PT Stop Time 1443    PT Time Calculation (min) 46 min    Activity Tolerance Patient tolerated treatment well    Behavior During Therapy WFL for tasks assessed/performed          Past Medical History:  Diagnosis Date   Allergy    Anxiety    Eczema    as a child,- 84 yo -- no current problems   Hypothyroid    PONV (postoperative nausea and vomiting)    Pre-diabetes    no meds - diet controlled   Past Surgical History:  Procedure Laterality Date   CESAREAN SECTION  2012, 2016   x 2   DILATION AND CURETTAGE OF UTERUS     x 2 - MAB   DILATION AND CURETTAGE OF UTERUS     for 3rd miscarriage    DILATION AND EVACUATION N/A 04/18/2017   Procedure: DILATATION AND EVACUATION;  Surgeon: Alger Gong, MD;  Location: [facility];  Service: Gynecology;  Laterality: N/A;   KNEE CARTILAGE SURGERY Bilateral 1998, 2018   1998 left, 2018 right   LAPAROSCOPY  2013   blocked fallopian tube   WISDOM TOOTH EXTRACTION     Patient Active Problem List   Diagnosis Date Noted   Missed abortion    Mild persistent asthma without complication 01/05/2016   Other allergic rhinitis 01/05/2016   Acute non-recurrent maxillary sinusitis 01/05/2016    PCP: Andrew, MD  REFERRING PROVIDER: Aluisio, MD  REFERRING DIAG: s/p left TKA  THERAPY DIAG:  Stiffness of left knee, not elsewhere classified  Localized edema  Difficulty in walking, not elsewhere classified  Acute pain of left knee  Rationale for Evaluation and Treatment: Rehabilitation  ONSET DATE: 11/21/23  SUBJECTIVE:   SUBJECTIVE STATEMENT: Patient was unhappy with her last session, felt like she was hurt and  there was no empathy shown.   Patient reports that she has had issues since a lateral meniscus tear in 2018, she underwent a left TKA on 11/21/23.  She reports doing okay since surgery.  PERTINENT HISTORY: Pre diabetes PAIN:  Are you having pain? Yes: NPRS scale: 3/10 Pain location: left knee Pain description: ache Aggravating factors: getting up from sleep, standing and bending raising leg up, pain is up to 7/10 Relieving factors: pain meds, rest, ice , elevation at best a 2/10  PRECAUTIONS: None  RED FLAGS: None   WEIGHT BEARING RESTRICTIONS: No  FALLS:  Has patient fallen in last 6 months? No  LIVING ENVIRONMENT: Lives with: lives with their family Lives in: House/apartment Stairs: 3 steps to car Has following equipment at home: [name] - 2 wheeled  OCCUPATION: realtor. Stairs, walking  PLOF: Independent and yard work, house work, has a 45 and 51 year old  PATIENT GOALS: walk without any issues kick soccer ball, like to go on a hike. Ride a bike  NEXT MD VISIT: 12/05/23  OBJECTIVE:  Note: Objective measures were completed at Evaluation unless otherwise noted.  DIAGNOSTIC FINDINGS: ne  PATIENT SURVEYS:  LEFS = 8.8%  COGNITION: Overall cognitive status: Within functional limits for tasks assessed     SENSATION: [facility]  EDEMA:  Circumferential: right mid patella 38 cm, 44 cm  left mid patella  MUSCLE LENGTH: Tight HS and calves  POSTURE: rounded shoulders and forward head  PALPATION: Mild tenderness to the left knee, the left quad is extremely tight, tightness in the ITB and the left buttock as well, she could not relax, worried about this being a significant habit that could cause hip/back issues  LOWER EXTREMITY ROM:  Active ROM AROM  LEft eval PROM Left eval AROM 11/27/23 AROM Left 12/07/23  Hip flexion      Hip extension      Hip abduction      Hip adduction      Hip internal rotation      Hip external rotation      Knee flexion 90 96 92 92  Knee  extension 60 28 19 13   Ankle dorsiflexion      Ankle plantarflexion      Ankle inversion      Ankle eversion       (Blank rows = not tested)  LOWER EXTREMITY MMT:  very poor quads, could not lift leg off the floor, pain in the patella   LOWER EXTREMITY SPECIAL TESTS:  Knee special tests: quad lag  FUNCTIONAL TESTS:  Timed up and go (TUG): 22 seconds with walker  GAIT: Distance walked: 100 feet Assistive device utilized: Environmental consultant - 2 wheeled Level of assistance: SBA Comments: Patient does not straighten the leg, tends to not bear full weight on the left leg and uses her arms for most weight bearing, she does stick her bottom out and leans way forward, I feel that she is doing this due to pain and fear of quad activation, as noted above she could not lift the leg due to pain in the patella with quad activation                                                                                                                                TREATMENT DATE:  12/11/23 Passive knee flexion stretch, some use of the Tgun to allow less pain and more ROM, STM, scar mob and patellar mobs Joint distraction REally worked multiple ways on getting flexion stretch as well as extension. Went over ways to do at home for low load long duration stretches Then we really worked on normalizing her gait more natural bend at toe off and decreasing left hip backward rotation, walked with her outside with focus on this Slant board stretch 2 step downs left foot staying on step, also tried 4  12/07/23 L knee PROM w/ end range holds   L knee patella mobs  Leg press 40lb 2x10, LLE 20lb 2x5  LAQ 3lb 2x10 HS curls green 2x10 Sit to stands holding yellow ball 6in step up s  12/04/23 NuStep L 5 x 6 min L knee PROM w/ end range holds   L knee patella mobs  Sit to stand 2x10 LAQ  RLE 5lb 2x10 HS curls green 2x10 Heel raises 2x15 Leg press 40lb 2x10   11/30/23 Nustep level 4 x 6 minutes 15# HS curls Back  to wall TKE with ball behind knee and with red tband Feet on ball K2C, rotation, small bridge, isometric abs SAQ cues verbal and tactile Passive stretch flexion and extension, some joint mobs and some joint distraction Worked on gait, decreasing trunk lean laterally and trunk flexion, trying to get better toe off and a natural knee flexion without device  11/27/23 NuStep  L4 x 6 min L knee PROM w/ end range holds   L knee patella mobs  L knee quad sets x10 LLE LAQ 2lb 2x10 LLE HS curls red 2x10   Sit to stand 2x10 Standing march 2x10    11/23/23 Evaluation Nustep level 4 x 4 minutes  HEP Education on walking   PATIENT EDUCATION:  Education details: POC/HEP Person educated: Patient Education method: Programmer, multimedia, Facilities manager, Actor cues, Verbal cues, and Handouts Education comprehension: verbalized understanding, returned demonstration, verbal cues required, tactile cues required, and needs further education  HOME EXERCISE PROGRAM: Access Code: [email or web address] URL: [email or web address] Date: 11/23/2023 Prepared by: Ozell Mainland  Exercises - Supine Quad Set  - 2 x daily - 7 x weekly - 2 sets - 10 reps - 3 hold - Supine Short Arc Quad  - 2 x daily - 7 x weekly - 2 sets - 10 reps - 3 hold - Supine Heel Slide  - 2 x daily - 7 x weekly - 1 sets - 10 reps - 10 hold - Seated Hamstring Stretch with Chair  - 2 x daily - 7 x weekly - 2 sets - 5 reps - 30 hold  ASSESSMENT:  CLINICAL IMPRESSION:  She has a forward trunk lean due to decrease L knee TKE.  REally focused on her ROM trying to push her to pain levels 6-7/10, she reports last visit pain was a 9/10, tried to do light but long duration. Cue needed to hold contraction with LAQ. Postural cue needed with step up to prevent excessive trunk lean.   Patient is a 53 y.o. female who was seen today for physical therapy treatment for s/p left TKA.  She had surgery 9/2, she is very guarded in the left quad, hip area.  She  has very poor quad extension.  With ambulation she has the knee in flexion with a forward flexed posture due to decrease TKE.   OBJECTIVE IMPAIRMENTS: Abnormal gait, cardiopulmonary status limiting activity, decreased activity tolerance, decreased balance, decreased coordination, decreased endurance, decreased mobility, difficulty walking, decreased ROM, decreased strength, increased edema, increased fascial restrictions, increased muscle spasms, impaired flexibility, improper body mechanics, postural dysfunction, and pain.   REHAB POTENTIAL: Good  CLINICAL DECISION MAKING: Evolving/moderate complexity  EVALUATION COMPLEXITY: Low   GOALS: Goals reviewed with patient? Yes  SHORT TERM GOALS: Target date: 12/14/23 Independent with initial HEP Baseline: Goal status: Met 11/27/23  LONG TERM GOALS: Target date: 02/22/24  Independent with advanced HEP Baseline:  Goal status: progressing 12/11/23  2.  Decrease pain overall 50% for higher quality of life Baseline:  Goal status: INITIAL  3.  Increase left knee AROM to 0-120 degrees flexion for increase functional gait and independence Baseline:  Goal status: ongoing 12/07/23  4.  Decrease TUG time to 11 seconds for functional gait Baseline:  Goal status: INITIAL  5.  Be able to go up and down stairs step over step without device Baseline:  Goal status: INITIAL  6.  Ambulate all distances without device with minimal deviations of gait Baseline:  Goal status: INITIAL   PLAN:  PT FREQUENCY: 2x/week  PT DURATION: 12 weeks  PLANNED INTERVENTIONS: 97164- PT Re-evaluation, 97110-Therapeutic exercises, 97530- Therapeutic activity, 97112- Neuromuscular re-education, 97535- Self Care, 02859- Manual therapy, [phone]- Gait training, [phone]- Vasopneumatic device, Patient/Family education, Balance training, Stair training, Joint mobilization, and Cryotherapy  PLAN FOR NEXT SESSION: work on quad activation and gait   [employer],  PT 12/11/2023, 1:57 PM

## 2023-12-14 ENCOUNTER — Ambulatory Visit: Admitting: Physical Therapy

## 2023-12-15 ENCOUNTER — Encounter: Payer: Self-pay | Admitting: Physical Therapy

## 2023-12-15 ENCOUNTER — Ambulatory Visit: Admitting: Physical Therapy

## 2023-12-15 DIAGNOSIS — M25562 Pain in left knee: Secondary | ICD-10-CM

## 2023-12-15 DIAGNOSIS — R262 Difficulty in walking, not elsewhere classified: Secondary | ICD-10-CM

## 2023-12-15 DIAGNOSIS — R600 Localized edema: Secondary | ICD-10-CM

## 2023-12-15 DIAGNOSIS — M25662 Stiffness of left knee, not elsewhere classified: Secondary | ICD-10-CM

## 2023-12-15 NOTE — Therapy (Signed)
 OUTPATIENT PHYSICAL THERAPY LOWER EXTREMITY TREATMENT   Patient Name: Tamara Bender MRN: [id number] DOB:Aug 07, 1970, 53 y.o., female Today's Date: 12/15/2023  END OF SESSION:  PT End of Session - 12/15/23 0947     Visit Number 7    Date for Recertification  02/22/24    Authorization Type BCBS    PT Start Time [phone]    PT Stop Time 1015    PT Time Calculation (min) 41 min    Activity Tolerance Patient tolerated treatment well    Behavior During Therapy WFL for tasks assessed/performed          Past Medical History:  Diagnosis Date   Allergy    Anxiety    Eczema    as a child,- 75 yo -- no current problems   Hypothyroid    PONV (postoperative nausea and vomiting)    Pre-diabetes    no meds - diet controlled   Past Surgical History:  Procedure Laterality Date   CESAREAN SECTION  2012, 2016   x 2   DILATION AND CURETTAGE OF UTERUS     x 2 - MAB   DILATION AND CURETTAGE OF UTERUS     for 3rd miscarriage    DILATION AND EVACUATION N/A 04/18/2017   Procedure: DILATATION AND EVACUATION;  Surgeon: Alger Gong, MD;  Location: [facility];  Service: Gynecology;  Laterality: N/A;   KNEE CARTILAGE SURGERY Bilateral 1998, 2018   1998 left, 2018 right   LAPAROSCOPY  2013   blocked fallopian tube   WISDOM TOOTH EXTRACTION     Patient Active Problem List   Diagnosis Date Noted   Missed abortion    Mild persistent asthma without complication 01/05/2016   Other allergic rhinitis 01/05/2016   Acute non-recurrent maxillary sinusitis 01/05/2016    PCP: Andrew, MD  REFERRING PROVIDER: Aluisio, MD  REFERRING DIAG: s/p left TKA  THERAPY DIAG:  Stiffness of left knee, not elsewhere classified  Localized edema  Difficulty in walking, not elsewhere classified  Acute pain of left knee  Rationale for Evaluation and Treatment: Rehabilitation  ONSET DATE: 11/21/23  SUBJECTIVE:   SUBJECTIVE STATEMENT: Patient reports a little sore after the last one.  Trying to work on the  stretch more   Patient reports that she has had issues since a lateral meniscus tear in 2018, she underwent a left TKA on 11/21/23.  She reports doing okay since surgery.  PERTINENT HISTORY: Pre diabetes PAIN:  Are you having pain? Yes: NPRS scale: 3/10 Pain location: left knee Pain description: ache Aggravating factors: getting up from sleep, standing and bending raising leg up, pain is up to 7/10 Relieving factors: pain meds, rest, ice , elevation at best a 2/10  PRECAUTIONS: None  RED FLAGS: None   WEIGHT BEARING RESTRICTIONS: No  FALLS:  Has patient fallen in last 6 months? No  LIVING ENVIRONMENT: Lives with: lives with their family Lives in: House/apartment Stairs: 3 steps to car Has following equipment at home: [name] - 2 wheeled  OCCUPATION: realtor. Stairs, walking  PLOF: Independent and yard work, house work, has a 27 and 51 year old  PATIENT GOALS: walk without any issues kick soccer ball, like to go on a hike. Ride a bike  NEXT MD VISIT: 12/05/23  OBJECTIVE:  Note: Objective measures were completed at Evaluation unless otherwise noted.  DIAGNOSTIC FINDINGS: ne  PATIENT SURVEYS:  LEFS = 8.8%  COGNITION: Overall cognitive status: Within functional limits for tasks assessed     SENSATION: WFL  EDEMA:  Circumferential: right mid patella 38 cm, 44 cm  left mid patella  MUSCLE LENGTH: Tight HS and calves  POSTURE: rounded shoulders and forward head  PALPATION: Mild tenderness to the left knee, the left quad is extremely tight, tightness in the ITB and the left buttock as well, she could not relax, worried about this being a significant habit that could cause hip/back issues  LOWER EXTREMITY ROM:  Active ROM AROM  LEft eval PROM Left eval AROM 11/27/23 AROM Left 12/07/23  Hip flexion      Hip extension      Hip abduction      Hip adduction      Hip internal rotation      Hip external rotation      Knee flexion 90 96 92 92  Knee extension 60 28  19 13   Ankle dorsiflexion      Ankle plantarflexion      Ankle inversion      Ankle eversion       (Blank rows = not tested)  LOWER EXTREMITY MMT:  very poor quads, could not lift leg off the floor, pain in the patella   LOWER EXTREMITY SPECIAL TESTS:  Knee special tests: quad lag  FUNCTIONAL TESTS:  Timed up and go (TUG): 22 seconds with walker  GAIT: Distance walked: 100 feet Assistive device utilized: Walker - 2 wheeled Level of assistance: SBA Comments: Patient does not straighten the leg, tends to not bear full weight on the left leg and uses her arms for most weight bearing, she does stick her bottom out and leans way forward, I feel that she is doing this due to pain and fear of quad activation, as noted above she could not lift the leg due to pain in the patella with quad activation                                                                                                                                TREATMENT DATE:  12/15/23 Passive stretches with Tgun, some LLLD, some contract relax, did both flexion and extension Joint distraction and mobilization Feet on ball K2C, rotation, bridge, isometric abs Ball b/n knees squeeze with bridge SAQ 2.5# 3 x10 Prone knee flexion stretch Leg press no weight working on flexion  down to position #4 Gait with cues for heel strike and a natural bend with toe off, tends to rotate left hip posteriorly  12/11/23 Passive knee flexion stretch, some use of the Tgun to allow less pain and more ROM, STM, scar mob and patellar mobs Joint distraction REally worked multiple ways on getting flexion stretch as well as extension. Went over ways to do at home for low load long duration stretches Then we really worked on normalizing her gait more natural bend at toe off and decreasing left hip backward rotation, walked with her outside with focus on this Slant board stretch 2 step downs left foot staying on step,  also tried 4  12/07/23 L  knee PROM w/ end range holds   L knee patella mobs  Leg press 40lb 2x10, LLE 20lb 2x5  LAQ 3lb 2x10 HS curls green 2x10 Sit to stands holding yellow ball 6in step up s  12/04/23 NuStep L 5 x 6 min L knee PROM w/ end range holds   L knee patella mobs  Sit to stand 2x10 LAQ RLE 5lb 2x10 HS curls green 2x10 Heel raises 2x15 Leg press 40lb 2x10   11/30/23 Nustep level 4 x 6 minutes 15# HS curls Back to wall TKE with ball behind knee and with red tband Feet on ball K2C, rotation, small bridge, isometric abs SAQ cues verbal and tactile Passive stretch flexion and extension, some joint mobs and some joint distraction Worked on gait, decreasing trunk lean laterally and trunk flexion, trying to get better toe off and a natural knee flexion without device  11/27/23 NuStep  L4 x 6 min L knee PROM w/ end range holds   L knee patella mobs  L knee quad sets x10 LLE LAQ 2lb 2x10 LLE HS curls red 2x10   Sit to stand 2x10 Standing march 2x10    11/23/23 Evaluation Nustep level 4 x 4 minutes  HEP Education on walking   PATIENT EDUCATION:  Education details: POC/HEP Person educated: Patient Education method: Programmer, multimedia, Facilities manager, Actor cues, Verbal cues, and Handouts Education comprehension: verbalized understanding, returned demonstration, verbal cues required, tactile cues required, and needs further education  HOME EXERCISE PROGRAM: Access Code: [email or web address] URL: [email or web address] Date: 11/23/2023 Prepared by: Ozell Mainland  Exercises - Supine Quad Set  - 2 x daily - 7 x weekly - 2 sets - 10 reps - 3 hold - Supine Short Arc Quad  - 2 x daily - 7 x weekly - 2 sets - 10 reps - 3 hold - Supine Heel Slide  - 2 x daily - 7 x weekly - 1 sets - 10 reps - 10 hold - Seated Hamstring Stretch with Chair  - 2 x daily - 7 x weekly - 2 sets - 5 reps - 30 hold  ASSESSMENT:  CLINICAL IMPRESSION:  She has a forward trunk lean due to decrease L knee TKE.  REally  focused on her ROM trying to push her to pain levels 6-7/10, Trying to talk with her throughout the session helps her not focus on the pain and resist, use of the T-gun has also helped, I do feel she is allowing more stretch, she does need a lot of assist with walking as she compensates a lot  Patient is a 53 y.o. female who was seen today for physical therapy treatment for s/p left TKA.  She had surgery 9/2, she is very guarded in the left quad, hip area.  She has very poor quad extension.  With ambulation she has the knee in flexion with a forward flexed posture due to decrease TKE.   OBJECTIVE IMPAIRMENTS: Abnormal gait, cardiopulmonary status limiting activity, decreased activity tolerance, decreased balance, decreased coordination, decreased endurance, decreased mobility, difficulty walking, decreased ROM, decreased strength, increased edema, increased fascial restrictions, increased muscle spasms, impaired flexibility, improper body mechanics, postural dysfunction, and pain.   REHAB POTENTIAL: Good  CLINICAL DECISION MAKING: Evolving/moderate complexity  EVALUATION COMPLEXITY: Low   GOALS: Goals reviewed with patient? Yes  SHORT TERM GOALS: Target date: 12/14/23 Independent with initial HEP Baseline: Goal status: Met 11/27/23  LONG TERM GOALS: Target date: 02/22/24  Independent with advanced HEP Baseline:  Goal status: progressing 12/11/23  2.  Decrease pain overall 50% for higher quality of life Baseline:  Goal status: INITIAL  3.  Increase left knee AROM to 0-120 degrees flexion for increase functional gait and independence Baseline:  Goal status: ongoing 12/07/23  4.  Decrease TUG time to 11 seconds for functional gait Baseline:  Goal status: INITIAL  5.  Be able to go up and down stairs step over step without device Baseline:  Goal status:ongoing 12/15/23  6.  Ambulate all distances without device with minimal deviations of gait Baseline:  Goal status:  INITIAL   PLAN:  PT FREQUENCY: 2x/week  PT DURATION: 12 weeks  PLANNED INTERVENTIONS: 97164- PT Re-evaluation, 97110-Therapeutic exercises, 97530- Therapeutic activity, 97112- Neuromuscular re-education, 97535- Self Care, [id number]- Manual therapy, [phone]- Gait training, [phone]- Vasopneumatic device, Patient/Family education, Balance training, Stair training, Joint mobilization, and Cryotherapy  PLAN FOR NEXT SESSION: work on quad activation and gait   [employer], PT 12/15/2023, 9:48 AM

## 2023-12-17 ENCOUNTER — Other Ambulatory Visit: Payer: Self-pay

## 2023-12-18 ENCOUNTER — Other Ambulatory Visit (HOSPITAL_COMMUNITY): Payer: Self-pay

## 2023-12-18 ENCOUNTER — Ambulatory Visit

## 2023-12-18 DIAGNOSIS — R600 Localized edema: Secondary | ICD-10-CM

## 2023-12-18 DIAGNOSIS — R262 Difficulty in walking, not elsewhere classified: Secondary | ICD-10-CM

## 2023-12-18 DIAGNOSIS — M25562 Pain in left knee: Secondary | ICD-10-CM | POA: Diagnosis not present

## 2023-12-18 DIAGNOSIS — M25662 Stiffness of left knee, not elsewhere classified: Secondary | ICD-10-CM

## 2023-12-18 NOTE — Therapy (Signed)
 OUTPATIENT PHYSICAL THERAPY LOWER EXTREMITY TREATMENT   Patient Name: Tamara Bender MRN: [id number] DOB:1970-10-31, 53 y.o., female Today's Date: 12/19/2023  END OF SESSION:  PT End of Session - 12/19/23 1708     Visit Number 8    Date for Recertification  02/22/24    Authorization Type BCBS    PT Start Time 1345    PT Stop Time 1428    PT Time Calculation (min) 43 min    Activity Tolerance Patient tolerated treatment well    Behavior During Therapy WFL for tasks assessed/performed           Past Medical History:  Diagnosis Date   Allergy    Anxiety    Eczema    as a child,- 60 yo -- no current problems   Hypothyroid    PONV (postoperative nausea and vomiting)    Pre-diabetes    no meds - diet controlled   Past Surgical History:  Procedure Laterality Date   CESAREAN SECTION  2012, 2016   x 2   DILATION AND CURETTAGE OF UTERUS     x 2 - MAB   DILATION AND CURETTAGE OF UTERUS     for 3rd miscarriage    DILATION AND EVACUATION N/A 04/18/2017   Procedure: DILATATION AND EVACUATION;  Surgeon: Alger Gong, MD;  Location: [facility];  Service: Gynecology;  Laterality: N/A;   KNEE CARTILAGE SURGERY Bilateral 1998, 2018   1998 left, 2018 right   LAPAROSCOPY  2013   blocked fallopian tube   WISDOM TOOTH EXTRACTION     Patient Active Problem List   Diagnosis Date Noted   Missed abortion    Mild persistent asthma without complication 01/05/2016   Other allergic rhinitis 01/05/2016   Acute non-recurrent maxillary sinusitis 01/05/2016    PCP: Andrew, MD  REFERRING PROVIDER: Melodi, MD  REFERRING DIAG: s/p left TKA  THERAPY DIAG:  Stiffness of left knee, not elsewhere classified  Localized edema  Difficulty in walking, not elsewhere classified  Acute pain of left knee  Rationale for Evaluation and Treatment: Rehabilitation  ONSET DATE: 11/21/23  SUBJECTIVE:   SUBJECTIVE STATEMENT: Patient reports a little sore, just really wants her knee to move  better   Patient reports that she has had issues since a lateral meniscus tear in 2018, she underwent a left TKA on 11/21/23.  She reports doing okay since surgery.  PERTINENT HISTORY: Pre diabetes PAIN:  Are you having pain? Yes: NPRS scale: 3/10 Pain location: left knee Pain description: ache Aggravating factors: getting up from sleep, standing and bending raising leg up, pain is up to 7/10 Relieving factors: pain meds, rest, ice , elevation at best a 2/10  PRECAUTIONS: None  RED FLAGS: None   WEIGHT BEARING RESTRICTIONS: No  FALLS:  Has patient fallen in last 6 months? No  LIVING ENVIRONMENT: Lives with: lives with their family Lives in: House/apartment Stairs: 3 steps to car Has following equipment at home: [name] - 2 wheeled  OCCUPATION: realtor. Stairs, walking  PLOF: Independent and yard work, house work, has a 6 and 51 year old  PATIENT GOALS: walk without any issues kick soccer ball, like to go on a hike. Ride a bike  NEXT MD VISIT: 12/05/23  OBJECTIVE:  Note: Objective measures were completed at Evaluation unless otherwise noted.  DIAGNOSTIC FINDINGS: ne  PATIENT SURVEYS:  LEFS = 8.8%  COGNITION: Overall cognitive status: Within functional limits for tasks assessed     SENSATION: WFL  EDEMA:  Circumferential: right  mid patella 38 cm, 44 cm  left mid patella  MUSCLE LENGTH: Tight HS and calves  POSTURE: rounded shoulders and forward head  PALPATION: Mild tenderness to the left knee, the left quad is extremely tight, tightness in the ITB and the left buttock as well, she could not relax, worried about this being a significant habit that could cause hip/back issues  LOWER EXTREMITY ROM:  Active ROM AROM  LEft eval PROM Left eval AROM 11/27/23 AROM Left 12/07/23  Hip flexion      Hip extension      Hip abduction      Hip adduction      Hip internal rotation      Hip external rotation      Knee flexion 90 96 92 92  Knee extension 60 28 19 13    Ankle dorsiflexion      Ankle plantarflexion      Ankle inversion      Ankle eversion       (Blank rows = not tested)  LOWER EXTREMITY MMT:  very poor quads, could not lift leg off the floor, pain in the patella   LOWER EXTREMITY SPECIAL TESTS:  Knee special tests: quad lag  FUNCTIONAL TESTS:  Timed up and go (TUG): 22 seconds with walker  GAIT: Distance walked: 100 feet Assistive device utilized: Environmental consultant - 2 wheeled Level of assistance: SBA Comments: Patient does not straighten the leg, tends to not bear full weight on the left leg and uses her arms for most weight bearing, she does stick her bottom out and leans way forward, I feel that she is doing this due to pain and fear of quad activation, as noted above she could not lift the leg due to pain in the patella with quad activation                                                                                                                                TREATMENT DATE:  12/18/23: Seated for L knee flexion stretch, supine for patellar mobs, deep cross friction massage distal L quads, retrograde massage med and lat knee jt line Supine for distraction L lower leg, combined with quad sets to improve L knee extension Rom Prone for TKE's with bolster under ankles  Prone for L knee flexion stretch, contract/ relax technique end range Supine for L ant hip quads stretch L LE off table for contract/ relax  Standing B heel drops , 15 sec holds, with heel raises Lunges on step L LE to improve L knee flexion ROM Kinesiotaping L ant knee, 2 I pieces, surrounding patella to improve positioning of patella and reduce soft tissue restrictions distal L quads  12/15/23 Passive stretches with Tgun, some LLLD, some contract relax, did both flexion and extension Joint distraction and mobilization Feet on ball K2C, rotation, bridge, isometric abs Ball b/n knees squeeze with bridge SAQ 2.5# 3 x10 Prone knee flexion stretch Leg press  no weight  working on flexion  down to position #4 Gait with cues for heel strike and a natural bend with toe off, tends to rotate left hip posteriorly  12/11/23 Passive knee flexion stretch, some use of the Tgun to allow less pain and more ROM, STM, scar mob and patellar mobs Joint distraction REally worked multiple ways on getting flexion stretch as well as extension. Went over ways to do at home for low load long duration stretches Then we really worked on normalizing her gait more natural bend at toe off and decreasing left hip backward rotation, walked with her outside with focus on this Slant board stretch 2 step downs left foot staying on step, also tried 4  12/07/23 L knee PROM w/ end range holds   L knee patella mobs  Leg press 40lb 2x10, LLE 20lb 2x5  LAQ 3lb 2x10 HS curls green 2x10 Sit to stands holding yellow ball 6in step up s  12/04/23 NuStep L 5 x 6 min L knee PROM w/ end range holds   L knee patella mobs  Sit to stand 2x10 LAQ RLE 5lb 2x10 HS curls green 2x10 Heel raises 2x15 Leg press 40lb 2x10   11/30/23 Nustep level 4 x 6 minutes 15# HS curls Back to wall TKE with ball behind knee and with red tband Feet on ball K2C, rotation, small bridge, isometric abs SAQ cues verbal and tactile Passive stretch flexion and extension, some joint mobs and some joint distraction Worked on gait, decreasing trunk lean laterally and trunk flexion, trying to get better toe off and a natural knee flexion without device  11/27/23 NuStep  L4 x 6 min L knee PROM w/ end range holds   L knee patella mobs  L knee quad sets x10 LLE LAQ 2lb 2x10 LLE HS curls red 2x10   Sit to stand 2x10 Standing march 2x10    11/23/23 Evaluation Nustep level 4 x 4 minutes  HEP Education on walking   PATIENT EDUCATION:  Education details: POC/HEP Person educated: Patient Education method: Programmer, multimedia, Facilities manager, Actor cues, Verbal cues, and Handouts Education comprehension: verbalized  understanding, returned demonstration, verbal cues required, tactile cues required, and needs further education  HOME EXERCISE PROGRAM: Access Code: [email or web address] URL: [email or web address] Date: 11/23/2023 Prepared by: Ozell Mainland  Exercises - Supine Quad Set  - 2 x daily - 7 x weekly - 2 sets - 10 reps - 3 hold - Supine Short Arc Quad  - 2 x daily - 7 x weekly - 2 sets - 10 reps - 3 hold - Supine Heel Slide  - 2 x daily - 7 x weekly - 1 sets - 10 reps - 10 hold - Seated Hamstring Stretch with Chair  - 2 x daily - 7 x weekly - 2 sets - 5 reps - 30 hold  ASSESSMENT:  CLINICAL IMPRESSION:  Today demonstrated additional home exercises to improve her flexibility L knee.  She is very flexed, B hips, forward leaning trunk, attempted supine hip ext stretch to improve her overall posture. Some improvement in postural awareness, gait mechanics by end of session noted.   Patient is a 53 y.o. female who was seen today for physical therapy treatment for s/p left TKA.  She had surgery 9/2, she is very guarded in the left quad, hip area.  She has very poor quad extension.  With ambulation she has the knee in flexion with a forward flexed posture due to decrease TKE.   OBJECTIVE IMPAIRMENTS: Abnormal gait,  cardiopulmonary status limiting activity, decreased activity tolerance, decreased balance, decreased coordination, decreased endurance, decreased mobility, difficulty walking, decreased ROM, decreased strength, increased edema, increased fascial restrictions, increased muscle spasms, impaired flexibility, improper body mechanics, postural dysfunction, and pain.   REHAB POTENTIAL: Good  CLINICAL DECISION MAKING: Evolving/moderate complexity  EVALUATION COMPLEXITY: Low   GOALS: Goals reviewed with patient? Yes  SHORT TERM GOALS: Target date: 12/14/23 Independent with initial HEP Baseline: Goal status: Met 11/27/23  LONG TERM GOALS: Target date: 02/22/24  Independent with advanced  HEP Baseline:  Goal status: progressing 12/11/23  2.  Decrease pain overall 50% for higher quality of life Baseline:  Goal status: INITIAL  3.  Increase left knee AROM to 0-120 degrees flexion for increase functional gait and independence Baseline:  Goal status: ongoing 12/07/23  4.  Decrease TUG time to 11 seconds for functional gait Baseline:  Goal status: INITIAL  5.  Be able to go up and down stairs step over step without device Baseline:  Goal status:ongoing 12/15/23  6.  Ambulate all distances without device with minimal deviations of gait Baseline:  Goal status: INITIAL   PLAN:  PT FREQUENCY: 2x/week  PT DURATION: 12 weeks  PLANNED INTERVENTIONS: 97164- PT Re-evaluation, 97110-Therapeutic exercises, 97530- Therapeutic activity, 97112- Neuromuscular re-education, 97535- Self Care, 02859- Manual therapy, [phone]- Gait training, [phone]- Vasopneumatic device, Patient/Family education, Balance training, Stair training, Joint mobilization, and Cryotherapy  PLAN FOR NEXT SESSION: work on quad activation and gait   Zyara Riling L Ralene Gasparyan, PT, DPT, OCS 12/19/2023, 5:09 PM

## 2023-12-19 ENCOUNTER — Other Ambulatory Visit: Payer: Self-pay

## 2023-12-21 ENCOUNTER — Encounter: Payer: Self-pay | Admitting: Physical Therapy

## 2023-12-21 ENCOUNTER — Ambulatory Visit: Attending: Orthopedic Surgery | Admitting: Physical Therapy

## 2023-12-21 DIAGNOSIS — M25662 Stiffness of left knee, not elsewhere classified: Secondary | ICD-10-CM | POA: Insufficient documentation

## 2023-12-21 DIAGNOSIS — R600 Localized edema: Secondary | ICD-10-CM | POA: Diagnosis present

## 2023-12-21 DIAGNOSIS — M25562 Pain in left knee: Secondary | ICD-10-CM | POA: Diagnosis present

## 2023-12-21 DIAGNOSIS — R262 Difficulty in walking, not elsewhere classified: Secondary | ICD-10-CM | POA: Diagnosis present

## 2023-12-21 NOTE — Therapy (Signed)
 OUTPATIENT PHYSICAL THERAPY LOWER EXTREMITY TREATMENT   Patient Name: Tamara Bender MRN: [id number] DOB:18-Aug-1970, 53 y.o., female Today's Date: 12/21/2023  END OF SESSION:  PT End of Session - 12/21/23 1326     Visit Number 9    Date for Recertification  02/22/24    Authorization Type BCBS    PT Start Time 1315    PT Stop Time 1400    PT Time Calculation (min) 45 min    Activity Tolerance Patient tolerated treatment well    Behavior During Therapy WFL for tasks assessed/performed           Past Medical History:  Diagnosis Date   Allergy    Anxiety    Eczema    as a child,- 29 yo -- no current problems   Hypothyroid    PONV (postoperative nausea and vomiting)    Pre-diabetes    no meds - diet controlled   Past Surgical History:  Procedure Laterality Date   CESAREAN SECTION  2012, 2016   x 2   DILATION AND CURETTAGE OF UTERUS     x 2 - MAB   DILATION AND CURETTAGE OF UTERUS     for 3rd miscarriage    DILATION AND EVACUATION N/A 04/18/2017   Procedure: DILATATION AND EVACUATION;  Surgeon: Alger Gong, MD;  Location: [facility];  Service: Gynecology;  Laterality: N/A;   KNEE CARTILAGE SURGERY Bilateral 1998, 2018   1998 left, 2018 right   LAPAROSCOPY  2013   blocked fallopian tube   WISDOM TOOTH EXTRACTION     Patient Active Problem List   Diagnosis Date Noted   Missed abortion    Mild persistent asthma without complication 01/05/2016   Other allergic rhinitis 01/05/2016   Acute non-recurrent maxillary sinusitis 01/05/2016    PCP: Andrew, MD  REFERRING PROVIDER: Melodi, MD  REFERRING DIAG: s/p left TKA  THERAPY DIAG:  Stiffness of left knee, not elsewhere classified  Localized edema  Difficulty in walking, not elsewhere classified  Acute pain of left knee  Rationale for Evaluation and Treatment: Rehabilitation  ONSET DATE: 11/21/23  SUBJECTIVE:   SUBJECTIVE STATEMENT: Patient reports that she did some exercises before coming in and is  feeling better   Patient reports that she has had issues since a lateral meniscus tear in 2018, she underwent a left TKA on 11/21/23.  She reports doing okay since surgery.  PERTINENT HISTORY: Pre diabetes PAIN:  Are you having pain? Yes: NPRS scale: 3/10 Pain location: left knee Pain description: ache Aggravating factors: getting up from sleep, standing and bending raising leg up, pain is up to 7/10 Relieving factors: pain meds, rest, ice , elevation at best a 2/10  PRECAUTIONS: None  RED FLAGS: None   WEIGHT BEARING RESTRICTIONS: No  FALLS:  Has patient fallen in last 6 months? No  LIVING ENVIRONMENT: Lives with: lives with their family Lives in: House/apartment Stairs: 3 steps to car Has following equipment at home: [name] - 2 wheeled  OCCUPATION: realtor. Stairs, walking  PLOF: Independent and yard work, house work, has a 47 and 51 year old  PATIENT GOALS: walk without any issues kick soccer ball, like to go on a hike. Ride a bike  NEXT MD VISIT: 12/05/23  OBJECTIVE:  Note: Objective measures were completed at Evaluation unless otherwise noted.  DIAGNOSTIC FINDINGS: ne  PATIENT SURVEYS:  LEFS = 8.8%  COGNITION: Overall cognitive status: Within functional limits for tasks assessed     SENSATION: WFL  EDEMA:  Circumferential:  right mid patella 38 cm, 44 cm  left mid patella  MUSCLE LENGTH: Tight HS and calves  POSTURE: rounded shoulders and forward head  PALPATION: Mild tenderness to the left knee, the left quad is extremely tight, tightness in the ITB and the left buttock as well, she could not relax, worried about this being a significant habit that could cause hip/back issues  LOWER EXTREMITY ROM:  Active ROM AROM  LEft eval PROM Left eval AROM 11/27/23 AROM Left 12/07/23 A/PROM 12/21/23  Hip flexion       Hip extension       Hip abduction       Hip adduction       Hip internal rotation       Hip external rotation       Knee flexion 90 96 92 92  102/106  Knee extension 60 28 19 13  12/7  Ankle dorsiflexion       Ankle plantarflexion       Ankle inversion       Ankle eversion        (Blank rows = not tested)  LOWER EXTREMITY MMT:  very poor quads, could not lift leg off the floor, pain in the patella   LOWER EXTREMITY SPECIAL TESTS:  Knee special tests: quad lag  FUNCTIONAL TESTS:  Timed up and go (TUG): 22 seconds with walker  GAIT: Distance walked: 100 feet Assistive device utilized: Environmental consultant - 2 wheeled Level of assistance: SBA Comments: Patient does not straighten the leg, tends to not bear full weight on the left leg and uses her arms for most weight bearing, she does stick her bottom out and leans way forward, I feel that she is doing this due to pain and fear of quad activation, as noted above she could not lift the leg due to pain in the patella with quad activation                                                                                                                                TREATMENT DATE:  12/21/23 Gait outside, practice curbs, HHA and then some stairs trying to step down and go up stressing the surgical knee Nustep level 5 x 5 minutes Worked on gait mechanics with natural bend, less hip motions Leg press various angles working on flexion, toes one hand width from the top and going down to position 3 Passive stretch flexion and extension Lunge step stretch Scar mobs, joint mobs and joint distraction Seated self PROM knee flexion stretch ROM measured as noted above  12/18/23: Seated for L knee flexion stretch, supine for patellar mobs, deep cross friction massage distal L quads, retrograde massage med and lat knee jt line Supine for distraction L lower leg, combined with quad sets to improve L knee extension Rom Prone for TKE's with bolster under ankles  Prone for L knee flexion stretch, contract/ relax technique end range Supine for L ant  hip quads stretch L LE off table for contract/ relax   Standing B heel drops , 15 sec holds, with heel raises Lunges on step L LE to improve L knee flexion ROM Kinesiotaping L ant knee, 2 I pieces, surrounding patella to improve positioning of patella and reduce soft tissue restrictions distal L quads  12/15/23 Passive stretches with Tgun, some LLLD, some contract relax, did both flexion and extension Joint distraction and mobilization Feet on ball K2C, rotation, bridge, isometric abs Ball b/n knees squeeze with bridge SAQ 2.5# 3 x10 Prone knee flexion stretch Leg press no weight working on flexion  down to position #4 Gait with cues for heel strike and a natural bend with toe off, tends to rotate left hip posteriorly  12/11/23 Passive knee flexion stretch, some use of the Tgun to allow less pain and more ROM, STM, scar mob and patellar mobs Joint distraction REally worked multiple ways on getting flexion stretch as well as extension. Went over ways to do at home for low load long duration stretches Then we really worked on normalizing her gait more natural bend at toe off and decreasing left hip backward rotation, walked with her outside with focus on this Slant board stretch 2 step downs left foot staying on step, also tried 4  12/07/23 L knee PROM w/ end range holds   L knee patella mobs  Leg press 40lb 2x10, LLE 20lb 2x5  LAQ 3lb 2x10 HS curls green 2x10 Sit to stands holding yellow ball 6in step up s  12/04/23 NuStep L 5 x 6 min L knee PROM w/ end range holds   L knee patella mobs  Sit to stand 2x10 LAQ RLE 5lb 2x10 HS curls green 2x10 Heel raises 2x15 Leg press 40lb 2x10   11/30/23 Nustep level 4 x 6 minutes 15# HS curls Back to wall TKE with ball behind knee and with red tband Feet on ball K2C, rotation, small bridge, isometric abs SAQ cues verbal and tactile Passive stretch flexion and extension, some joint mobs and some joint distraction Worked on gait, decreasing trunk lean laterally and trunk flexion, trying to  get better toe off and a natural knee flexion without device  11/27/23 NuStep  L4 x 6 min L knee PROM w/ end range holds   L knee patella mobs  L knee quad sets x10 LLE LAQ 2lb 2x10 LLE HS curls red 2x10   Sit to stand 2x10 Standing march 2x10    11/23/23 Evaluation Nustep level 4 x 4 minutes  HEP Education on walking   PATIENT EDUCATION:  Education details: POC/HEP Person educated: Patient Education method: Programmer, multimedia, Facilities manager, Actor cues, Verbal cues, and Handouts Education comprehension: verbalized understanding, returned demonstration, verbal cues required, tactile cues required, and needs further education  HOME EXERCISE PROGRAM: Access Code: [email or web address] URL: [email or web address] Date: 11/23/2023 Prepared by: Ozell Mainland  Exercises - Supine Quad Set  - 2 x daily - 7 x weekly - 2 sets - 10 reps - 3 hold - Supine Short Arc Quad  - 2 x daily - 7 x weekly - 2 sets - 10 reps - 3 hold - Supine Heel Slide  - 2 x daily - 7 x weekly - 1 sets - 10 reps - 10 hold - Seated Hamstring Stretch with Chair  - 2 x daily - 7 x weekly - 2 sets - 5 reps - 30 hold  ASSESSMENT:  CLINICAL IMPRESSION:  [place] and tried some stairs today, she actually did okay  with this, worked on both flexion and extension ROM as well as some quad improvement.  I feel like she is walking better today with less compensation of the hip.  She has made good improvements in ROM, She seems to be having less pain and less guarded with passive stretch  Patient is a 53 y.o. female who was seen today for physical therapy treatment for s/p left TKA.  She had surgery 9/2, she is very guarded in the left quad, hip area.  She has very poor quad extension.  With ambulation she has the knee in flexion with a forward flexed posture due to decrease TKE.   OBJECTIVE IMPAIRMENTS: Abnormal gait, cardiopulmonary status limiting activity, decreased activity tolerance, decreased balance, decreased coordination,  decreased endurance, decreased mobility, difficulty walking, decreased ROM, decreased strength, increased edema, increased fascial restrictions, increased muscle spasms, impaired flexibility, improper body mechanics, postural dysfunction, and pain.   REHAB POTENTIAL: Good  CLINICAL DECISION MAKING: Evolving/moderate complexity  EVALUATION COMPLEXITY: Low   GOALS: Goals reviewed with patient? Yes  SHORT TERM GOALS: Target date: 12/14/23 Independent with initial HEP Baseline: Goal status: Met 11/27/23  LONG TERM GOALS: Target date: 02/22/24  Independent with advanced HEP Baseline:  Goal status: progressing 12/11/23  2.  Decrease pain overall 50% for higher quality of life Baseline:  Goal status: progressing 12/21/23  3.  Increase left knee AROM to 0-120 degrees flexion for increase functional gait and independence Baseline:  Goal status:progressing 12/21/23  4.  Decrease TUG time to 11 seconds for functional gait Baseline:  Goal status: INITIAL  5.  Be able to go up and down stairs step over step without device Baseline:  Goal status:ongoing 12/15/23  6.  Ambulate all distances without device with minimal deviations of gait Baseline:  Goal status: progressing 12/21/23, not using device   PLAN:  PT FREQUENCY: 2x/week  PT DURATION: 12 weeks  PLANNED INTERVENTIONS: 97164- PT Re-evaluation, 97110-Therapeutic exercises, 97530- Therapeutic activity, 97112- Neuromuscular re-education, 97535- Self Care, 02859- Manual therapy, [phone]- Gait training, [phone]- Vasopneumatic device, Patient/Family education, Balance training, Stair training, Joint mobilization, and Cryotherapy  PLAN FOR NEXT SESSION: work on quad activation and gait, ROM   Mertie Haslem W, PT,  12/21/2023, 1:26 PM

## 2023-12-25 ENCOUNTER — Encounter: Payer: Self-pay | Admitting: Physical Therapy

## 2023-12-25 ENCOUNTER — Ambulatory Visit: Admitting: Physical Therapy

## 2023-12-25 DIAGNOSIS — R600 Localized edema: Secondary | ICD-10-CM

## 2023-12-25 DIAGNOSIS — M25662 Stiffness of left knee, not elsewhere classified: Secondary | ICD-10-CM

## 2023-12-25 DIAGNOSIS — M25562 Pain in left knee: Secondary | ICD-10-CM

## 2023-12-25 DIAGNOSIS — R262 Difficulty in walking, not elsewhere classified: Secondary | ICD-10-CM

## 2023-12-25 NOTE — Therapy (Signed)
 OUTPATIENT PHYSICAL THERAPY LOWER EXTREMITY TREATMENT   Patient Name: Tamara Bender MRN: [id number] DOB:[birth year], 53 y.o., female Today's Date: 12/25/2023  END OF SESSION:  PT End of Session - 12/25/23 1617     Visit Number 10    Date for Recertification  02/22/24    Authorization Type BCBS    PT Start Time 1615    PT Stop Time 1700    PT Time Calculation (min) 45 min    Activity Tolerance Patient tolerated treatment well    Behavior During Therapy WFL for tasks assessed/performed           Past Medical History:  Diagnosis Date   Allergy    Anxiety    Eczema    as a child,- 19 yo -- no current problems   Hypothyroid    PONV (postoperative nausea and vomiting)    Pre-diabetes    no meds - diet controlled   Past Surgical History:  Procedure Laterality Date   CESAREAN SECTION  2012, 2016   x 2   DILATION AND CURETTAGE OF UTERUS     x 2 - MAB   DILATION AND CURETTAGE OF UTERUS     for 3rd miscarriage    DILATION AND EVACUATION N/A 04/18/2017   Procedure: DILATATION AND EVACUATION;  Surgeon: Alger Gong, MD;  Location: [facility];  Service: Gynecology;  Laterality: N/A;   KNEE CARTILAGE SURGERY Bilateral 1998, 2018   1998 left, 2018 right   LAPAROSCOPY  2013   blocked fallopian tube   WISDOM TOOTH EXTRACTION     Patient Active Problem List   Diagnosis Date Noted   Missed abortion    Mild persistent asthma without complication 01/05/2016   Other allergic rhinitis 01/05/2016   Acute non-recurrent maxillary sinusitis 01/05/2016    PCP: Andrew, MD  REFERRING PROVIDER: Melodi, MD  REFERRING DIAG: s/p left TKA  THERAPY DIAG:  Stiffness of left knee, not elsewhere classified  Localized edema  Difficulty in walking, not elsewhere classified  Acute pain of left knee  Rationale for Evaluation and Treatment: Rehabilitation  ONSET DATE: 11/21/23  SUBJECTIVE:   SUBJECTIVE STATEMENT: Patient reports that she went out yesterday at art in the [place]  and ended up walking a mile, she reports that she is sore from that and is stiff Patient reports that she has had issues since a lateral meniscus tear in 2018, she underwent a left TKA on 11/21/23.  She reports doing okay since surgery.  PERTINENT HISTORY: Pre diabetes PAIN:  Are you having pain? Yes: NPRS scale: 3/10 Pain location: left knee Pain description: ache Aggravating factors: getting up from sleep, standing and bending raising leg up, pain is up to 7/10 Relieving factors: pain meds, rest, ice , elevation at best a 2/10  PRECAUTIONS: None  RED FLAGS: None   WEIGHT BEARING RESTRICTIONS: No  FALLS:  Has patient fallen in last 6 months? No  LIVING ENVIRONMENT: Lives with: lives with their family Lives in: House/apartment Stairs: 3 steps to car Has following equipment at home: [name] - 2 wheeled  OCCUPATION: realtor. Stairs, walking  PLOF: Independent and yard work, house work, has a 48 and 51 year old  PATIENT GOALS: walk without any issues kick soccer ball, like to go on a hike. Ride a bike  NEXT MD VISIT: 12/05/23  OBJECTIVE:  Note: Objective measures were completed at Evaluation unless otherwise noted.  DIAGNOSTIC FINDINGS: ne  PATIENT SURVEYS:  LEFS = 8.8%  COGNITION: Overall cognitive status: Within functional limits  for tasks assessed     SENSATION: WFL  EDEMA:  Circumferential: right mid patella 38 cm, 44 cm  left mid patella  MUSCLE LENGTH: Tight HS and calves  POSTURE: rounded shoulders and forward head  PALPATION: Mild tenderness to the left knee, the left quad is extremely tight, tightness in the ITB and the left buttock as well, she could not relax, worried about this being a significant habit that could cause hip/back issues  LOWER EXTREMITY ROM:  Active ROM AROM  LEft eval PROM Left eval AROM 11/27/23 AROM Left 12/07/23 A/PROM 12/21/23  Hip flexion       Hip extension       Hip abduction       Hip adduction       Hip internal  rotation       Hip external rotation       Knee flexion 90 96 92 92 102/106  Knee extension 60 28 19 13  12/7  Ankle dorsiflexion       Ankle plantarflexion       Ankle inversion       Ankle eversion        (Blank rows = not tested)  LOWER EXTREMITY MMT:  very poor quads, could not lift leg off the floor, pain in the patella   LOWER EXTREMITY SPECIAL TESTS:  Knee special tests: quad lag  FUNCTIONAL TESTS:  Timed up and go (TUG): 22 seconds with walker  GAIT: Distance walked: 100 feet Assistive device utilized: Environmental consultant - 2 wheeled Level of assistance: SBA Comments: Patient does not straighten the leg, tends to not bear full weight on the left leg and uses her arms for most weight bearing, she does stick her bottom out and leans way forward, I feel that she is doing this due to pain and fear of quad activation, as noted above she could not lift the leg due to pain in the patella with quad activation                                                                                                                                TREATMENT DATE:  12/25/23 Nustep level 5 x 6 minutes Gait outside around the parking [name] good pace working on posture with this with some verbal and tactile cues Bike partial revs x 4 minutes LEg curls 15# 2x10  [employer]ire[employer] marching Left foot on 6 step and doing step downs  6 step up fwd and side Leg press 20# seat position #4 and #3 40# resisted gait all directions T mill pushes fwd and backward Passive stretch into flexion and extension  12/21/23 Gait outside, practice curbs, HHA and then some stairs trying to step down and go up stressing the surgical knee Nustep level 5 x 5 minutes Worked on gait mechanics with natural bend, less hip motions Leg press various angles working on flexion, toes one hand width from the top and going down to  position 3 Passive stretch flexion and extension Lunge step stretch Scar mobs, joint mobs and joint  distraction Seated self PROM knee flexion stretch ROM measured as noted above  12/18/23: Seated for L knee flexion stretch, supine for patellar mobs, deep cross friction massage distal L quads, retrograde massage med and lat knee jt line Supine for distraction L lower leg, combined with quad sets to improve L knee extension Rom Prone for TKE's with bolster under ankles  Prone for L knee flexion stretch, contract/ relax technique end range Supine for L ant hip quads stretch L LE off table for contract/ relax  Standing B heel drops , 15 sec holds, with heel raises Lunges on step L LE to improve L knee flexion ROM Kinesiotaping L ant knee, 2 I pieces, surrounding patella to improve positioning of patella and reduce soft tissue restrictions distal L quads  12/15/23 Passive stretches with Tgun, some LLLD, some contract relax, did both flexion and extension Joint distraction and mobilization Feet on ball K2C, rotation, bridge, isometric abs Ball b/n knees squeeze with bridge SAQ 2.5# 3 x10 Prone knee flexion stretch Leg press no weight working on flexion  down to position #4 Gait with cues for heel strike and a natural bend with toe off, tends to rotate left hip posteriorly  12/11/23 Passive knee flexion stretch, some use of the Tgun to allow less pain and more ROM, STM, scar mob and patellar mobs Joint distraction REally worked multiple ways on getting flexion stretch as well as extension. Went over ways to do at home for low load long duration stretches Then we really worked on normalizing her gait more natural bend at toe off and decreasing left hip backward rotation, walked with her outside with focus on this Slant board stretch 2 step downs left foot staying on step, also tried 4  12/07/23 L knee PROM w/ end range holds   L knee patella mobs  Leg press 40lb 2x10, LLE 20lb 2x5  LAQ 3lb 2x10 HS curls green 2x10 Sit to stands holding yellow ball 6in step up s  12/04/23 NuStep L 5  x 6 min L knee PROM w/ end range holds   L knee patella mobs  Sit to stand 2x10 LAQ RLE 5lb 2x10 HS curls green 2x10 Heel raises 2x15 Leg press 40lb 2x10   11/30/23 Nustep level 4 x 6 minutes 15# HS curls Back to wall TKE with ball behind knee and with red tband Feet on ball K2C, rotation, small bridge, isometric abs SAQ cues verbal and tactile Passive stretch flexion and extension, some joint mobs and some joint distraction Worked on gait, decreasing trunk lean laterally and trunk flexion, trying to get better toe off and a natural knee flexion without device  11/27/23 NuStep  L4 x 6 min L knee PROM w/ end range holds   L knee patella mobs  L knee quad sets x10 LLE LAQ 2lb 2x10 LLE HS curls red 2x10   Sit to stand 2x10 Standing march 2x10    11/23/23 Evaluation Nustep level 4 x 4 minutes  HEP Education on walking   PATIENT EDUCATION:  Education details: POC/HEP Person educated: Patient Education method: Programmer, multimedia, Facilities manager, Actor cues, Verbal cues, and Handouts Education comprehension: verbalized understanding, returned demonstration, verbal cues required, tactile cues required, and needs further education  HOME EXERCISE PROGRAM: Access Code: [email or web address] URL: [email or web address] Date: 11/23/2023 Prepared by: Ozell Mainland  Exercises - Supine Quad Set  - 2 x daily - 7 x weekly -  2 sets - 10 reps - 3 hold - Supine Short Arc Quad  - 2 x daily - 7 x weekly - 2 sets - 10 reps - 3 hold - Supine Heel Slide  - 2 x daily - 7 x weekly - 1 sets - 10 reps - 10 hold - Seated Hamstring Stretch with Chair  - 2 x daily - 7 x weekly - 2 sets - 5 reps - 30 hold  ASSESSMENT:  CLINICAL IMPRESSION: Patient continues to improve, she allows more and more ROM, she cannot do a full revolution yet on the bike.  She still has a slight bend in the knee and also hip posterior rotation with walking but it is smoothing out, I did add a few more exercises today and she  tolerated well some loss of balance so CGA is needed  Patient is a 53 y.o. female who was seen today for physical therapy treatment for s/p left TKA.  She had surgery 9/2, she is very guarded in the left quad, hip area.  She has very poor quad extension.  With ambulation she has the knee in flexion with a forward flexed posture due to decrease TKE.   OBJECTIVE IMPAIRMENTS: Abnormal gait, cardiopulmonary status limiting activity, decreased activity tolerance, decreased balance, decreased coordination, decreased endurance, decreased mobility, difficulty walking, decreased ROM, decreased strength, increased edema, increased fascial restrictions, increased muscle spasms, impaired flexibility, improper body mechanics, postural dysfunction, and pain.   REHAB POTENTIAL: Good  CLINICAL DECISION MAKING: Evolving/moderate complexity  EVALUATION COMPLEXITY: Low   GOALS: Goals reviewed with patient? Yes  SHORT TERM GOALS: Target date: 12/14/23 Independent with initial HEP Baseline: Goal status: Met 11/27/23  LONG TERM GOALS: Target date: 02/22/24  Independent with advanced HEP Baseline:  Goal status: progressing 12/11/23  2.  Decrease pain overall 50% for higher quality of life Baseline:  Goal status: progressing 12/21/23  3.  Increase left knee AROM to 0-120 degrees flexion for increase functional gait and independence Baseline:  Goal status:progressing 12/21/23  4.  Decrease TUG time to 11 seconds for functional gait Baseline:  Goal status: INITIAL  5.  Be able to go up and down stairs step over step without device Baseline:  Goal status:ongoing 12/15/23  6.  Ambulate all distances without device with minimal deviations of gait Baseline:  Goal status: progressing 12/21/23, not using device   PLAN:  PT FREQUENCY: 2x/week  PT DURATION: 12 weeks  PLANNED INTERVENTIONS: 97164- PT Re-evaluation, 97110-Therapeutic exercises, 97530- Therapeutic activity, 97112- Neuromuscular re-education,  97535- Self Care, 02859- Manual therapy, [phone]- Gait training, [phone]- Vasopneumatic device, Patient/Family education, Balance training, Stair training, Joint mobilization, and Cryotherapy  PLAN FOR NEXT SESSION: push ROM and better gait, higher level activities   Davidmichael Zarazua W, PT,  12/25/2023, 4:18 PM

## 2023-12-28 ENCOUNTER — Encounter: Payer: Self-pay | Admitting: Physical Therapy

## 2023-12-28 ENCOUNTER — Ambulatory Visit: Admitting: Physical Therapy

## 2023-12-28 DIAGNOSIS — M25562 Pain in left knee: Secondary | ICD-10-CM

## 2023-12-28 DIAGNOSIS — R262 Difficulty in walking, not elsewhere classified: Secondary | ICD-10-CM

## 2023-12-28 DIAGNOSIS — M25662 Stiffness of left knee, not elsewhere classified: Secondary | ICD-10-CM | POA: Diagnosis not present

## 2023-12-28 DIAGNOSIS — R600 Localized edema: Secondary | ICD-10-CM

## 2023-12-28 NOTE — Therapy (Signed)
 OUTPATIENT PHYSICAL THERAPY LOWER EXTREMITY TREATMENT   Patient Name: Tamara Bender MRN: [id number] DOB:19-Jun-1970, 53 y.o., female Today's Date: 12/28/2023  END OF SESSION:  PT End of Session - 12/28/23 1318     Visit Number 11    Date for Recertification  02/22/24    Authorization Type BCBS  10/12    PT Start Time 1315    PT Stop Time 1400    PT Time Calculation (min) 45 min    Activity Tolerance Patient tolerated treatment well    Behavior During Therapy WFL for tasks assessed/performed           Past Medical History:  Diagnosis Date   Allergy    Anxiety    Eczema    as a child,- 71 yo -- no current problems   Hypothyroid    PONV (postoperative nausea and vomiting)    Pre-diabetes    no meds - diet controlled   Past Surgical History:  Procedure Laterality Date   CESAREAN SECTION  2012, 2016   x 2   DILATION AND CURETTAGE OF UTERUS     x 2 - MAB   DILATION AND CURETTAGE OF UTERUS     for 3rd miscarriage    DILATION AND EVACUATION N/A 04/18/2017   Procedure: DILATATION AND EVACUATION;  Surgeon: Tamara Gong, MD;  Location: [facility];  Service: Gynecology;  Laterality: N/A;   KNEE CARTILAGE SURGERY Bilateral 1998, 2018   1998 left, 2018 right   LAPAROSCOPY  2013   blocked fallopian tube   WISDOM TOOTH EXTRACTION     Patient Active Problem List   Diagnosis Date Noted   Missed abortion    Mild persistent asthma without complication 01/05/2016   Other allergic rhinitis 01/05/2016   Acute non-recurrent maxillary sinusitis 01/05/2016    PCP: Andrew, MD  REFERRING PROVIDER: Melodi, MD  REFERRING DIAG: s/p left TKA  THERAPY DIAG:  Stiffness of left knee, not elsewhere classified  Localized edema  Difficulty in walking, not elsewhere classified  Acute pain of left knee  Rationale for Evaluation and Treatment: Rehabilitation  ONSET DATE: 11/21/23  SUBJECTIVE:   SUBJECTIVE STATEMENT: Saw the surgeon, today, he was happy with her progress, thinks  that she is doing well, said he wanted to have 6 more weeks of PT Patient reports that she has had issues since a lateral meniscus tear in 2018, she underwent a left TKA on 11/21/23.  She reports doing okay since surgery.  PERTINENT HISTORY: Pre diabetes PAIN:  Are you having pain? Yes: NPRS scale: 3/10 Pain location: left knee Pain description: ache Aggravating factors: getting up from sleep, standing and bending raising leg up, pain is up to 7/10 Relieving factors: pain meds, rest, ice , elevation at best a 2/10  PRECAUTIONS: None  RED FLAGS: None   WEIGHT BEARING RESTRICTIONS: No  FALLS:  Has patient fallen in last 6 months? No  LIVING ENVIRONMENT: Lives with: lives with their family Lives in: House/apartment Stairs: 3 steps to car Has following equipment at home: [name] - 2 wheeled  OCCUPATION: realtor. Stairs, walking  PLOF: Independent and yard work, house work, has a 28 and 51 year old  PATIENT GOALS: walk without any issues kick soccer ball, like to go on a hike. Ride a bike  NEXT MD VISIT: 12/05/23  OBJECTIVE:  Note: Objective measures were completed at Evaluation unless otherwise noted.  DIAGNOSTIC FINDINGS: ne  PATIENT SURVEYS:  LEFS = 8.8%  COGNITION: Overall cognitive status: Within functional limits for  tasks assessed     SENSATION: WFL  EDEMA:  Circumferential: right mid patella 38 cm, 44 cm  left mid patella  MUSCLE LENGTH: Tight HS and calves  POSTURE: rounded shoulders and forward head  PALPATION: Mild tenderness to the left knee, the left quad is extremely tight, tightness in the ITB and the left buttock as well, she could not relax, worried about this being a significant habit that could cause hip/back issues  LOWER EXTREMITY ROM:  Active ROM AROM  LEft eval PROM Left eval AROM 11/27/23 AROM Left 12/07/23 A/PROM 12/21/23  Hip flexion       Hip extension       Hip abduction       Hip adduction       Hip internal rotation       Hip  external rotation       Knee flexion 90 96 92 92 102/106  Knee extension 60 28 19 13  12/7  Ankle dorsiflexion       Ankle plantarflexion       Ankle inversion       Ankle eversion        (Blank rows = not tested)  LOWER EXTREMITY MMT:  very poor quads, could not lift leg off the floor, pain in the patella   LOWER EXTREMITY SPECIAL TESTS:  Knee special tests: quad lag  FUNCTIONAL TESTS:  Timed up and go (TUG): 22 seconds with walker  GAIT: Distance walked: 100 feet Assistive device utilized: Environmental consultant - 2 wheeled Level of assistance: SBA Comments: Patient does not straighten the leg, tends to not bear full weight on the left leg and uses her arms for most weight bearing, she does stick her bottom out and leans way forward, I feel that she is doing this due to pain and fear of quad activation, as noted above she could not lift the leg due to pain in the patella with quad activation                                                                                                                                TREATMENT DATE:  12/28/23 Nustep level 4 LE only x 4 minutes Bike partial revs 4 minutes LEg press 20# working on flexion 40# resisted gait all directions 25# HS curls 5# leg extension Tmill pushes STM, scar mob, joint mobs  Passive stretch flexion and extension  12/25/23 Nustep level 5 x 6 minutes Gait outside around the parking Tamara Bender good pace working on posture with this with some verbal and tactile cues Bike partial revs x 4 minutes LEg curls 15# 2x10  Airex marching Left foot on 6 step and doing step downs  6 step up fwd and side Leg press 20# seat position #4 and #3 40# resisted gait all directions T mill pushes fwd and backward Passive stretch into flexion and extension  12/21/23 Gait outside, practice curbs, HHA and then some stairs trying  to step down and go up stressing the surgical knee Nustep level 5 x 5 minutes Worked on gait mechanics with natural  bend, less hip motions Leg press various angles working on flexion, toes one hand width from the top and going down to position 3 Passive stretch flexion and extension Lunge step stretch Scar mobs, joint mobs and joint distraction Seated self PROM knee flexion stretch ROM measured as noted above  12/18/23: Seated for L knee flexion stretch, supine for patellar mobs, deep cross friction massage distal L quads, retrograde massage med and lat knee jt line Supine for distraction L lower leg, combined with quad sets to improve L knee extension Rom Prone for TKE's with bolster under ankles  Prone for L knee flexion stretch, contract/ relax technique end range Supine for L ant hip quads stretch L LE off table for contract/ relax  Standing B heel drops , 15 sec holds, with heel raises Lunges on step L LE to improve L knee flexion ROM Kinesiotaping L ant knee, 2 I pieces, surrounding patella to improve positioning of patella and reduce soft tissue restrictions distal L quads  12/15/23 Passive stretches with Tgun, some LLLD, some contract relax, did both flexion and extension Joint distraction and mobilization Feet on ball K2C, rotation, bridge, isometric abs Ball b/n knees squeeze with bridge SAQ 2.5# 3 x10 Prone knee flexion stretch Leg press no weight working on flexion  down to position #4 Gait with cues for heel strike and a natural bend with toe off, tends to rotate left hip posteriorly  12/11/23 Passive knee flexion stretch, some use of the Tgun to allow less pain and more ROM, STM, scar mob and patellar mobs Joint distraction REally worked multiple ways on getting flexion stretch as well as extension. Went over ways to do at home for low load long duration stretches Then we really worked on normalizing her gait more natural bend at toe off and decreasing left hip backward rotation, walked with her outside with focus on this Slant board stretch 2 step downs left foot staying on step,  also tried 4  12/07/23 L knee PROM w/ end range holds   L knee patella mobs  Leg press 40lb 2x10, LLE 20lb 2x5  LAQ 3lb 2x10 HS curls green 2x10 Sit to stands holding yellow ball 6in step up s  12/04/23 NuStep L 5 x 6 min L knee PROM w/ end range holds   L knee patella mobs  Sit to stand 2x10 LAQ RLE 5lb 2x10 HS curls green 2x10 Heel raises 2x15 Leg press 40lb 2x10   11/30/23 Nustep level 4 x 6 minutes 15# HS curls Back to wall TKE with ball behind knee and with red tband Feet on ball K2C, rotation, small bridge, isometric abs SAQ cues verbal and tactile Passive stretch flexion and extension, some joint mobs and some joint distraction Worked on gait, decreasing trunk lean laterally and trunk flexion, trying to get better toe off and a natural knee flexion without device  11/27/23 NuStep  L4 x 6 min L knee PROM w/ end range holds   L knee patella mobs  L knee quad sets x10 LLE LAQ 2lb 2x10 LLE HS curls red 2x10   Sit to stand 2x10 Standing march 2x10    11/23/23 Evaluation Nustep level 4 x 4 minutes  HEP Education on walking   PATIENT EDUCATION:  Education details: POC/HEP Person educated: Patient Education method: Programmer, multimedia, Facilities manager, Actor cues, Verbal cues, and Handouts Education comprehension:  verbalized understanding, returned demonstration, verbal cues required, tactile cues required, and needs further education  HOME EXERCISE PROGRAM: Access Code: [email or web address] URL: [email or web address] Date: 11/23/2023 Prepared by: Ozell Mainland  Exercises - Supine Quad Set  - 2 x daily - 7 x weekly - 2 sets - 10 reps - 3 hold - Supine Short Arc Quad  - 2 x daily - 7 x weekly - 2 sets - 10 reps - 3 hold - Supine Heel Slide  - 2 x daily - 7 x weekly - 1 sets - 10 reps - 10 hold - Seated Hamstring Stretch with Chair  - 2 x daily - 7 x weekly - 2 sets - 5 reps - 30 hold  ASSESSMENT:  CLINICAL IMPRESSION: Patient reports that the MD was pleased  with her progress, feels like she needs 4-6 more weeks, I continue to push ROM and better gait, as well as strength and function  Patient is a 53 y.o. female who was seen today for physical therapy treatment for s/p left TKA.  She had surgery 9/2, she is very guarded in the left quad, hip area.  She has very poor quad extension.  With ambulation she has the knee in flexion with a forward flexed posture due to decrease TKE.   OBJECTIVE IMPAIRMENTS: Abnormal gait, cardiopulmonary status limiting activity, decreased activity tolerance, decreased balance, decreased coordination, decreased endurance, decreased mobility, difficulty walking, decreased ROM, decreased strength, increased edema, increased fascial restrictions, increased muscle spasms, impaired flexibility, improper body mechanics, postural dysfunction, and pain.   REHAB POTENTIAL: Good  CLINICAL DECISION MAKING: Evolving/moderate complexity  EVALUATION COMPLEXITY: Low   GOALS: Goals reviewed with patient? Yes  SHORT TERM GOALS: Target date: 12/14/23 Independent with initial HEP Baseline: Goal status: Met 11/27/23  LONG TERM GOALS: Target date: 02/22/24  Independent with advanced HEP Baseline:  Goal status: progressing 12/11/23  2.  Decrease pain overall 50% for higher quality of life Baseline:  Goal status: progressing 12/21/23  3.  Increase left knee AROM to 0-120 degrees flexion for increase functional gait and independence Baseline:  Goal status:progressing 12/21/23  4.  Decrease TUG time to 11 seconds for functional gait Baseline:  Goal status: INITIAL  5.  Be able to go up and down stairs step over step without device Baseline:  Goal status:ongoing 12/15/23  6.  Ambulate all distances without device with minimal deviations of gait Baseline:  Goal status: progressing 12/21/23, not using device   PLAN:  PT FREQUENCY: 2x/week  PT DURATION: 12 weeks  PLANNED INTERVENTIONS: 97164- PT Re-evaluation,  97110-Therapeutic exercises, 97530- Therapeutic activity, 97112- Neuromuscular re-education, 97535- Self Care, [id number]- Manual therapy, [phone]- Gait training, [phone]- Vasopneumatic device, Patient/Family education, Balance training, Stair training, Joint mobilization, and Cryotherapy  PLAN FOR NEXT SESSION: push ROM and better gait, higher level activities   Texie Tupou W, PT,  12/28/2023, 1:21 PM

## 2024-01-01 ENCOUNTER — Encounter: Payer: Self-pay | Admitting: Physical Therapy

## 2024-01-01 ENCOUNTER — Ambulatory Visit: Admitting: Physical Therapy

## 2024-01-01 DIAGNOSIS — M25662 Stiffness of left knee, not elsewhere classified: Secondary | ICD-10-CM

## 2024-01-01 DIAGNOSIS — R262 Difficulty in walking, not elsewhere classified: Secondary | ICD-10-CM

## 2024-01-01 DIAGNOSIS — R600 Localized edema: Secondary | ICD-10-CM

## 2024-01-01 DIAGNOSIS — M25562 Pain in left knee: Secondary | ICD-10-CM

## 2024-01-01 NOTE — Therapy (Signed)
 OUTPATIENT PHYSICAL THERAPY LOWER EXTREMITY TREATMENT   Patient Name: Tamara Bender MRN: [id number] DOB:12/21/1970, 53 y.o., female Today's Date: 01/01/2024  END OF SESSION:  PT End of Session - 01/01/24 1323     Visit Number 12    Date for Recertification  02/22/24    Authorization Type BCBS  11/12    PT Start Time 1317    PT Stop Time 1400    PT Time Calculation (min) 43 min    Activity Tolerance Patient tolerated treatment well    Behavior During Therapy WFL for tasks assessed/performed           Past Medical History:  Diagnosis Date   Allergy    Anxiety    Eczema    as a child,- 28 yo -- no current problems   Hypothyroid    PONV (postoperative nausea and vomiting)    Pre-diabetes    no meds - diet controlled   Past Surgical History:  Procedure Laterality Date   CESAREAN SECTION  2012, 2016   x 2   DILATION AND CURETTAGE OF UTERUS     x 2 - MAB   DILATION AND CURETTAGE OF UTERUS     for 3rd miscarriage    DILATION AND EVACUATION N/A 04/18/2017   Procedure: DILATATION AND EVACUATION;  Surgeon: Alger Gong, MD;  Location: [facility];  Service: Gynecology;  Laterality: N/A;   KNEE CARTILAGE SURGERY Bilateral 1998, 2018   1998 left, 2018 right   LAPAROSCOPY  2013   blocked fallopian tube   WISDOM TOOTH EXTRACTION     Patient Active Problem List   Diagnosis Date Noted   Missed abortion    Mild persistent asthma without complication 01/05/2016   Other allergic rhinitis 01/05/2016   Acute non-recurrent maxillary sinusitis 01/05/2016    PCP: Andrew, MD  REFERRING PROVIDER: Melodi, MD  REFERRING DIAG: s/p left TKA  THERAPY DIAG:  Stiffness of left knee, not elsewhere classified  Localized edema  Difficulty in walking, not elsewhere classified  Acute pain of left knee  Rationale for Evaluation and Treatment: Rehabilitation  ONSET DATE: 11/21/23  SUBJECTIVE:   SUBJECTIVE STATEMENT: I feel like I am doing better, less pain, a little more ROM  and walking better, last week Surgeon wanted me to go 6 more weeks.  Patient reports that she has had issues since a lateral meniscus tear in 2018, she underwent a left TKA on 11/21/23.  She reports doing okay since surgery.  PERTINENT HISTORY: Pre diabetes PAIN:  Are you having pain? Yes: NPRS scale: 1/10 Pain location: left knee Pain description: ache Aggravating factors: getting up from sleep, standing and bending raising leg up, pain is up to 7/10 Relieving factors: pain meds, rest, ice , elevation at best a 2/10  PRECAUTIONS: None  RED FLAGS: None   WEIGHT BEARING RESTRICTIONS: No  FALLS:  Has patient fallen in last 6 months? No  LIVING ENVIRONMENT: Lives with: lives with their family Lives in: House/apartment Stairs: 3 steps to car Has following equipment at home: [name] - 2 wheeled  OCCUPATION: realtor. Stairs, walking  PLOF: Independent and yard work, house work, has a 50 and 51 year old  PATIENT GOALS: walk without any issues kick soccer ball, like to go on a hike. Ride a bike  NEXT MD VISIT: 12/05/23  OBJECTIVE:  Note: Objective measures were completed at Evaluation unless otherwise noted.  DIAGNOSTIC FINDINGS: ne  PATIENT SURVEYS:  LEFS = 8.8%  COGNITION: Overall cognitive status: Within functional limits  for tasks assessed     SENSATION: WFL  EDEMA:  Circumferential: right mid patella 38 cm, 44 cm  left mid patella  MUSCLE LENGTH: Tight HS and calves  POSTURE: rounded shoulders and forward head  PALPATION: Mild tenderness to the left knee, the left quad is extremely tight, tightness in the ITB and the left buttock as well, she could not relax, worried about this being a significant habit that could cause hip/back issues  LOWER EXTREMITY ROM:  Active ROM AROM  LEft eval PROM Left eval AROM 11/27/23 AROM Left 12/07/23 A/PROM 12/21/23  Hip flexion       Hip extension       Hip abduction       Hip adduction       Hip internal rotation        Hip external rotation       Knee flexion 90 96 92 92 102/106  Knee extension 60 28 19 13  12/7  Ankle dorsiflexion       Ankle plantarflexion       Ankle inversion       Ankle eversion        (Blank rows = not tested)  LOWER EXTREMITY MMT:  very poor quads, could not lift leg off the floor, pain in the patella   LOWER EXTREMITY SPECIAL TESTS:  Knee special tests: quad lag  FUNCTIONAL TESTS:  Timed up and go (TUG): 22 seconds with walker  GAIT: Distance walked: 100 feet Assistive device utilized: Environmental consultant - 2 wheeled Level of assistance: SBA Comments: Patient does not straighten the leg, tends to not bear full weight on the left leg and uses her arms for most weight bearing, she does stick her bottom out and leans way forward, I feel that she is doing this due to pain and fear of quad activation, as noted above she could not lift the leg due to pain in the patella with quad activation                                                                                                                                TREATMENT DATE:  01/01/24 Bike partial revs x 6 minutes, tried some full revs with trunk lean Outside gait working on speed and form, up and down stairs step over step with CGA and handrail HS curls 2x10 25# LEg press 20# at level 3 and 4 with one hand width from toe to the top of foot plate, then same with no weight down to the bottom SAQ 5# 2x10 Feet on ball K2C, bridges, isometric abs Passive stretch into flexion and extension  12/28/23 Nustep level 4 LE only x 4 minutes Bike partial revs 4 minutes LEg press 20# working on flexion 40# resisted gait all directions 25# HS curls 5# leg extension Tmill pushes STM, scar mob, joint mobs  Passive stretch flexion and extension  12/25/23 Nustep level 5 x 6 minutes Gait outside  around the parking [name] good pace working on posture with this with some verbal and tactile cues Bike partial revs x 4 minutes LEg curls 15#  2x10  Airex marching Left foot on 6 step and doing step downs  6 step up fwd and side Leg press 20# seat position #4 and #3 40# resisted gait all directions T mill pushes fwd and backward Passive stretch into flexion and extension  12/21/23 Gait outside, practice curbs, HHA and then some stairs trying to step down and go up stressing the surgical knee Nustep level 5 x 5 minutes Worked on gait mechanics with natural bend, less hip motions Leg press various angles working on flexion, toes one hand width from the top and going down to position 3 Passive stretch flexion and extension Lunge step stretch Scar mobs, joint mobs and joint distraction Seated self PROM knee flexion stretch ROM measured as noted above  12/18/23: Seated for L knee flexion stretch, supine for patellar mobs, deep cross friction massage distal L quads, retrograde massage med and lat knee jt line Supine for distraction L lower leg, combined with quad sets to improve L knee extension Rom Prone for TKE's with bolster under ankles  Prone for L knee flexion stretch, contract/ relax technique end range Supine for L ant hip quads stretch L LE off table for contract/ relax  Standing B heel drops , 15 sec holds, with heel raises Lunges on step L LE to improve L knee flexion ROM Kinesiotaping L ant knee, 2 I pieces, surrounding patella to improve positioning of patella and reduce soft tissue restrictions distal L quads  12/15/23 Passive stretches with Tgun, some LLLD, some contract relax, did both flexion and extension Joint distraction and mobilization Feet on ball K2C, rotation, bridge, isometric abs Ball b/n knees squeeze with bridge SAQ 2.5# 3 x10 Prone knee flexion stretch Leg press no weight working on flexion  down to position #4 Gait with cues for heel strike and a natural bend with toe off, tends to rotate left hip posteriorly  12/11/23 Passive knee flexion stretch, some use of the Tgun to allow less pain and  more ROM, STM, scar mob and patellar mobs Joint distraction REally worked multiple ways on getting flexion stretch as well as extension. Went over ways to do at home for low load long duration stretches Then we really worked on normalizing her gait more natural bend at toe off and decreasing left hip backward rotation, walked with her outside with focus on this Slant board stretch 2 step downs left foot staying on step, also tried 4  12/07/23 L knee PROM w/ end range holds   L knee patella mobs  Leg press 40lb 2x10, LLE 20lb 2x5  LAQ 3lb 2x10 HS curls green 2x10 Sit to stands holding yellow ball 6in step up s  12/04/23 NuStep L 5 x 6 min L knee PROM w/ end range holds   L knee patella mobs  Sit to stand 2x10 LAQ RLE 5lb 2x10 HS curls green 2x10 Heel raises 2x15 Leg press 40lb 2x10   PATIENT EDUCATION:  Education details: POC/HEP Person educated: Patient Education method: Explanation, Demonstration, Tactile cues, Verbal cues, and Handouts Education comprehension: verbalized understanding, returned demonstration, verbal cues required, tactile cues required, and needs further education  HOME EXERCISE PROGRAM: Access Code: [email or web address] URL: [email or web address] Date: 11/23/2023 Prepared by: Ozell Mainland  Exercises - Supine Quad Set  - 2 x daily - 7 x weekly - 2 sets - 10 reps -  3 hold - Supine Short Arc Quad  - 2 x daily - 7 x weekly - 2 sets - 10 reps - 3 hold - Supine Heel Slide  - 2 x daily - 7 x weekly - 1 sets - 10 reps - 10 hold - Seated Hamstring Stretch with Chair  - 2 x daily - 7 x weekly - 2 sets - 5 reps - 30 hold  ASSESSMENT:  CLINICAL IMPRESSION: Patient improving with ROM and gait.  She still has some difficulty with her gait.  She is still very tight with flexion, feels like the skin is tearing per her, I tried in prone and she felt this more at that time  Patient is a 53 y.o. female who was seen today for physical therapy treatment for s/p  left TKA.  She had surgery 9/2, she is very guarded in the left quad, hip area.  She has very poor quad extension.  With ambulation she has the knee in flexion with a forward flexed posture due to decrease TKE.   OBJECTIVE IMPAIRMENTS: Abnormal gait, cardiopulmonary status limiting activity, decreased activity tolerance, decreased balance, decreased coordination, decreased endurance, decreased mobility, difficulty walking, decreased ROM, decreased strength, increased edema, increased fascial restrictions, increased muscle spasms, impaired flexibility, improper body mechanics, postural dysfunction, and pain.   REHAB POTENTIAL: Good  CLINICAL DECISION MAKING: Evolving/moderate complexity  EVALUATION COMPLEXITY: Low   GOALS: Goals reviewed with patient? Yes  SHORT TERM GOALS: Target date: 12/14/23 Independent with initial HEP Baseline: Goal status: Met 11/27/23  LONG TERM GOALS: Target date: 02/22/24  Independent with advanced HEP Baseline:  Goal status: progressing 12/11/23  2.  Decrease pain overall 50% for higher quality of life Baseline:  Goal status: progressing 12/21/23  3.  Increase left knee AROM to 0-120 degrees flexion for increase functional gait and independence Baseline:  Goal status:progressing 12/21/23  4.  Decrease TUG time to 11 seconds for functional gait Baseline:  Goal status: INITIAL  5.  Be able to go up and down stairs step over step without device Baseline:  Goal status:ongoing 12/15/23  6.  Ambulate all distances without device with minimal deviations of gait Baseline:  Goal status: progressing 12/21/23, not using device   PLAN:  PT FREQUENCY: 2x/week  PT DURATION: 12 weeks  PLANNED INTERVENTIONS: 97164- PT Re-evaluation, 97110-Therapeutic exercises, 97530- Therapeutic activity, 97112- Neuromuscular re-education, 97535- Self Care, 02859- Manual therapy, [phone]- Gait training, [phone]- Vasopneumatic device, Patient/Family education, Balance training, Stair  training, Joint mobilization, and Cryotherapy  PLAN FOR NEXT SESSION: push ROM and better gait, higher level activities   Lamario Mani W, PT,  01/01/2024, 1:23 PM

## 2024-01-04 ENCOUNTER — Encounter: Payer: Self-pay | Admitting: Physical Therapy

## 2024-01-04 ENCOUNTER — Ambulatory Visit: Admitting: Physical Therapy

## 2024-01-04 DIAGNOSIS — M25562 Pain in left knee: Secondary | ICD-10-CM

## 2024-01-04 DIAGNOSIS — M25662 Stiffness of left knee, not elsewhere classified: Secondary | ICD-10-CM

## 2024-01-04 DIAGNOSIS — R262 Difficulty in walking, not elsewhere classified: Secondary | ICD-10-CM

## 2024-01-04 DIAGNOSIS — R600 Localized edema: Secondary | ICD-10-CM

## 2024-01-04 NOTE — Therapy (Signed)
 OUTPATIENT PHYSICAL THERAPY LOWER EXTREMITY TREATMENT   Patient Name: Tamara Bender MRN: [id number] DOB:11-Jul-1970, 52 y.o., female Today's Date: 01/04/2024  END OF SESSION:  PT End of Session - 01/04/24 1325     Visit Number 13    Date for Recertification  02/22/24    Authorization Type [employer]  12/12    PT Start Time 1315    PT Stop Time 1400    PT Time Calculation (min) 45 min    Activity Tolerance Patient tolerated treatment well    Behavior During Therapy WFL for tasks assessed/performed           Past Medical History:  Diagnosis Date   Allergy    Anxiety    Eczema    as a child,- 37 yo -- no current problems   Hypothyroid    PONV (postoperative nausea and vomiting)    Pre-diabetes    no meds - diet controlled   Past Surgical History:  Procedure Laterality Date   CESAREAN SECTION  2012, 2016   x 2   DILATION AND CURETTAGE OF UTERUS     x 2 - [facility]   DILATION AND CURETTAGE OF UTERUS     for 3rd miscarriage    DILATION AND EVACUATION N/A 04/18/2017   Procedure: DILATATION AND EVACUATION;  Surgeon: Alger Gong, MD;  Location: [facility];  Service: Gynecology;  Laterality: N/A;   KNEE CARTILAGE SURGERY Bilateral 1998, 2018   1998 left, 2018 right   LAPAROSCOPY  2013   blocked fallopian tube   WISDOM TOOTH EXTRACTION     Patient Active Problem List   Diagnosis Date Noted   Missed abortion    Mild persistent asthma without complication 01/05/2016   Other allergic rhinitis 01/05/2016   Acute non-recurrent maxillary sinusitis 01/05/2016    PCP: Andrew, MD  REFERRING PROVIDER: Melodi, MD  REFERRING DIAG: s/p left TKA  THERAPY DIAG:  Stiffness of left knee, not elsewhere classified  Localized edema  Difficulty in walking, not elsewhere classified  Acute pain of left knee  Rationale for Evaluation and Treatment: Rehabilitation  ONSET DATE: 11/21/23  SUBJECTIVE:   SUBJECTIVE STATEMENT: I keep getting cramps in the left HS and calf reports a  little stiffer today  Patient reports that she has had issues since a lateral meniscus tear in 2018, she underwent a left TKA on 11/21/23.  She reports doing okay since surgery.  PERTINENT HISTORY: Pre diabetes PAIN:  Are you having pain? Yes: NPRS scale: 1/10 Pain location: left knee Pain description: ache Aggravating factors: getting up from sleep, standing and bending raising leg up, pain is up to 7/10 Relieving factors: pain meds, rest, ice , elevation at best a 2/10  PRECAUTIONS: None  RED FLAGS: None   WEIGHT BEARING RESTRICTIONS: No  FALLS:  Has patient fallen in last 6 months? No  LIVING ENVIRONMENT: Lives with: lives with their family Lives in: House/apartment Stairs: 3 steps to car Has following equipment at home: [name] - 2 wheeled  OCCUPATION: realtor. Stairs, walking  PLOF: Independent and yard work, house work, has a 65 and 51 year old  PATIENT GOALS: walk without any issues kick soccer ball, like to go on a hike. Ride a bike  NEXT MD VISIT: 12/05/23  OBJECTIVE:  Note: Objective measures were completed at Evaluation unless otherwise noted.  DIAGNOSTIC FINDINGS: ne  PATIENT SURVEYS:  LEFS = 8.8%  01/04/24 LEFS 39%  COGNITION: Overall cognitive status: Within functional limits for tasks assessed  SENSATION: WFL  EDEMA:  Circumferential: right mid patella 38 cm, 44 cm  left mid patella  MUSCLE LENGTH: Tight HS and calves  POSTURE: rounded shoulders and forward head  PALPATION: Mild tenderness to the left knee, the left quad is extremely tight, tightness in the ITB and the left buttock as well, she could not relax, worried about this being a significant habit that could cause hip/back issues  LOWER EXTREMITY ROM:  Active ROM AROM  LEft eval PROM Left eval AROM 11/27/23 AROM Left 12/07/23 A/PROM 12/21/23 A/PROM 01/04/24  Hip flexion        Hip extension        Hip abduction        Hip adduction        Hip internal rotation        Hip  external rotation        Knee flexion 90 96 92 92 102/106 106/110  Knee extension 60 28 19 13  12/7 10/0  Ankle dorsiflexion        Ankle plantarflexion        Ankle inversion        Ankle eversion         (Blank rows = not tested)  LOWER EXTREMITY MMT:  very poor quads, could not lift leg off the floor, pain in the patella   LOWER EXTREMITY SPECIAL TESTS:  Knee special tests: quad lag  FUNCTIONAL TESTS:  Timed up and go (TUG): 22 seconds with walker  01/04/24 TUG 12 seconds no device   GAIT: Distance walked: 100 feet Assistive device utilized: Environmental consultant - 2 wheeled Level of assistance: SBA Comments: Patient does not straighten the leg, tends to not bear full weight on the left leg and uses her arms for most weight bearing, she does stick her bottom out and leans way forward, I feel that she is doing this due to pain and fear of quad activation, as noted above she could not lift the leg due to pain in the patella with quad activation                                                                                                                                TREATMENT DATE:  01/04/24 Bike partial revs working to full revolutions with some trunk lean.   Gait outside working on form, step length and then stairs step over step with handrails PROM of the left knee flexion and extension STM to the left calf and HS, scar and patellar mobilizations Calf stretches LEg press working on going low to gain flexion TUG as noted above 12 seconds without device and did LEFS with her  SAQ 4# with cues for TKE Sit to stand without hands working on no trunk lean  01/01/24 Bike partial revs x 6 minutes, tried some full revs with trunk lean Outside gait working on speed and form, up and down stairs step over step with CGA and  handrail HS curls 2x10 25# LEg press 20# at level 3 and 4 with one hand width from toe to the top of foot plate, then same with no weight down to the bottom SAQ 5#  2x10 Feet on ball K2C, bridges, isometric abs Passive stretch into flexion and extension  12/28/23 Nustep level 4 LE only x 4 minutes Bike partial revs 4 minutes LEg press 20# working on flexion 40# resisted gait all directions 25# HS curls 5# leg extension Tmill pushes STM, scar mob, joint mobs  Passive stretch flexion and extension  12/25/23 Nustep level 5 x 6 minutes Gait outside around the parking [name] good pace working on posture with this with some verbal and tactile cues Bike partial revs x 4 minutes LEg curls 15# 2x10  Airex marching Left foot on 6 step and doing step downs  6 step up fwd and side Leg press 20# seat position #4 and #3 40# resisted gait all directions T mill pushes fwd and backward Passive stretch into flexion and extension  12/21/23 Gait outside, practice curbs, HHA and then some stairs trying to step down and go up stressing the surgical knee Nustep level 5 x 5 minutes Worked on gait mechanics with natural bend, less hip motions Leg press various angles working on flexion, toes one hand width from the top and going down to position 3 Passive stretch flexion and extension Lunge step stretch Scar mobs, joint mobs and joint distraction Seated self PROM knee flexion stretch ROM measured as noted above  12/18/23: Seated for L knee flexion stretch, supine for patellar mobs, deep cross friction massage distal L quads, retrograde massage med and lat knee jt line Supine for distraction L lower leg, combined with quad sets to improve L knee extension Rom Prone for TKE's with bolster under ankles  Prone for L knee flexion stretch, contract/ relax technique end range Supine for L ant hip quads stretch L LE off table for contract/ relax  Standing B heel drops , 15 sec holds, with heel raises Lunges on step L LE to improve L knee flexion ROM Kinesiotaping L ant knee, 2 I pieces, surrounding patella to improve positioning of patella and reduce soft tissue  restrictions distal L quads  12/15/23 Passive stretches with Tgun, some LLLD, some contract relax, did both flexion and extension Joint distraction and mobilization Feet on ball K2C, rotation, bridge, isometric abs Ball b/n knees squeeze with bridge SAQ 2.5# 3 x10 Prone knee flexion stretch Leg press no weight working on flexion  down to position #4 Gait with cues for heel strike and a natural bend with toe off, tends to rotate left hip posteriorly  12/11/23 Passive knee flexion stretch, some use of the Tgun to allow less pain and more ROM, STM, scar mob and patellar mobs Joint distraction REally worked multiple ways on getting flexion stretch as well as extension. Went over ways to do at home for low load long duration stretches Then we really worked on normalizing her gait more natural bend at toe off and decreasing left hip backward rotation, walked with her outside with focus on this Slant board stretch 2 step downs left foot staying on step, also tried 4  12/07/23 L knee PROM w/ end range holds   L knee patella mobs  Leg press 40lb 2x10, LLE 20lb 2x5  LAQ 3lb 2x10 HS curls green 2x10 Sit to stands holding yellow ball 6in step up s  12/04/23 NuStep L 5 x 6 min L knee  PROM w/ end range holds   L knee patella mobs  Sit to stand 2x10 LAQ RLE 5lb 2x10 HS curls green 2x10 Heel raises 2x15 Leg press 40lb 2x10   PATIENT EDUCATION:  Education details: POC/HEP Person educated: Patient Education method: Explanation, Demonstration, Tactile cues, Verbal cues, and Handouts Education comprehension: verbalized understanding, returned demonstration, verbal cues required, tactile cues required, and needs further education  HOME EXERCISE PROGRAM: Access Code: [email or web address] URL: [email or web address] Date: 11/23/2023 Prepared by: Ozell Mainland  Exercises - Supine Quad Set  - 2 x daily - 7 x weekly - 2 sets - 10 reps - 3 hold - Supine Short Arc Quad  - 2 x daily - 7 x  weekly - 2 sets - 10 reps - 3 hold - Supine Heel Slide  - 2 x daily - 7 x weekly - 1 sets - 10 reps - 10 hold - Seated Hamstring Stretch with Chair  - 2 x daily - 7 x weekly - 2 sets - 5 reps - 30 hold  ASSESSMENT:  CLINICAL IMPRESSION: As noted above in the columns her AROM and PROM continue to improve.  Her TUG time improved from 22 seconds with walker to 12 seconds without device.  Her LEFS score improved from 8.8% to 39 %, this still shows she is having moderate to significant limitations with her function.  She is starting to c/o cramps and spasms, in the left posterior lateral HS and calf.  She is still having difficulty on stairs and walking down slopes.  Still with limitations in ROM  Patient is a 53 y.o. female who was seen today for physical therapy treatment for s/p left TKA.  She had surgery 9/2, she is very guarded in the left quad, hip area.  She has very poor quad extension.  With ambulation she has the knee in flexion with a forward flexed posture due to decrease TKE.   OBJECTIVE IMPAIRMENTS: Abnormal gait, cardiopulmonary status limiting activity, decreased activity tolerance, decreased balance, decreased coordination, decreased endurance, decreased mobility, difficulty walking, decreased ROM, decreased strength, increased edema, increased fascial restrictions, increased muscle spasms, impaired flexibility, improper body mechanics, postural dysfunction, and pain.   REHAB POTENTIAL: Good  CLINICAL DECISION MAKING: Evolving/moderate complexity  EVALUATION COMPLEXITY: Low   GOALS: Goals reviewed with patient? Yes  SHORT TERM GOALS: Target date: 12/14/23 Independent with initial HEP Baseline: Goal status: Met 11/27/23  LONG TERM GOALS: Target date: 02/22/24  Independent with advanced HEP Baseline:  Goal status: progressing 01/04/24  2.  Decrease pain overall 50% for higher quality of life Baseline:  Goal status: progressing 12/21/23 progressing 01/04/24 still some  cramping  3.  Increase left knee AROM to 0-120 degrees flexion for increase functional gait and independence Baseline:  Goal status:progressing 110/16/25 10-106 degrees flexion  4.  Decrease TUG time to 11 seconds for functional gait Baseline:  Goal status: progressing 12 seconds no device  5.  Be able to go up and down stairs step over step without device Baseline:  Goal status:ongoing 12/15/23, able to do on 01/04/24 but still some difficulty  6.  Ambulate all distances without device with minimal deviations of gait Baseline:  Goal status: progressing 12/21/23, not using device, 01/04/24 not using device still has a slight bend in the knee with heel strike and has difficulty going down slopes   PLAN:  PT FREQUENCY: 2x/week  PT DURATION: 12 weeks  PLANNED INTERVENTIONS: 97164- PT Re-evaluation, 97110-Therapeutic exercises, 97530- Therapeutic activity, 97112- Neuromuscular re-education, 97535- Self Care,  02859- Manual therapy, [phone]- Gait training, [id number]- Vasopneumatic device, Patient/Family education, Balance training, Stair training, Joint mobilization, and Cryotherapy  PLAN FOR NEXT SESSION: push ROM and better gait, higher level activities   Lynda Wanninger W, PT,  01/04/2024, 1:27 PM

## 2024-01-08 ENCOUNTER — Ambulatory Visit: Admitting: Physical Therapy

## 2024-01-11 ENCOUNTER — Ambulatory Visit: Admitting: Physical Therapy

## 2024-01-11 ENCOUNTER — Encounter: Payer: Self-pay | Admitting: Physical Therapy

## 2024-01-11 DIAGNOSIS — R262 Difficulty in walking, not elsewhere classified: Secondary | ICD-10-CM

## 2024-01-11 DIAGNOSIS — M25662 Stiffness of left knee, not elsewhere classified: Secondary | ICD-10-CM | POA: Diagnosis not present

## 2024-01-11 DIAGNOSIS — M25562 Pain in left knee: Secondary | ICD-10-CM

## 2024-01-11 DIAGNOSIS — R600 Localized edema: Secondary | ICD-10-CM

## 2024-01-11 NOTE — Therapy (Signed)
 OUTPATIENT PHYSICAL THERAPY LOWER EXTREMITY TREATMENT   Patient Name: Tamara Bender MRN: [id number] DOB:25-Feb-1971, 53 y.o., female Today's Date: 01/11/2024  END OF SESSION:  PT End of Session - 01/11/24 1319     Visit Number 14    Date for Recertification  03/10/24    Authorization Type [employer] 1 of 6    PT Start Time 1315    PT Stop Time 1400    PT Time Calculation (min) 45 min    Activity Tolerance Patient tolerated treatment well    Behavior During Therapy WFL for tasks assessed/performed           Past Medical History:  Diagnosis Date   Allergy    Anxiety    Eczema    as a child,- 85 yo -- no current problems   Hypothyroid    PONV (postoperative nausea and vomiting)    Pre-diabetes    no meds - diet controlled   Past Surgical History:  Procedure Laterality Date   CESAREAN SECTION  2012, 2016   x 2   DILATION AND CURETTAGE OF UTERUS     x 2 - [facility]   DILATION AND CURETTAGE OF UTERUS     for 3rd miscarriage    DILATION AND EVACUATION N/A 04/18/2017   Procedure: DILATATION AND EVACUATION;  Surgeon: Alger Gong, MD;  Location: [facility];  Service: Gynecology;  Laterality: N/A;   KNEE CARTILAGE SURGERY Bilateral 1998, 2018   1998 left, 2018 right   LAPAROSCOPY  2013   blocked fallopian tube   WISDOM TOOTH EXTRACTION     Patient Active Problem List   Diagnosis Date Noted   Missed abortion    Mild persistent asthma without complication 01/05/2016   Other allergic rhinitis 01/05/2016   Acute non-recurrent maxillary sinusitis 01/05/2016    PCP: Andrew, MD  REFERRING PROVIDER: Melodi, MD  REFERRING DIAG: s/p left TKA  THERAPY DIAG:  Stiffness of left knee, not elsewhere classified  Localized edema  Difficulty in walking, not elsewhere classified  Acute pain of left knee  Rationale for Evaluation and Treatment: Rehabilitation  ONSET DATE: 11/21/23  SUBJECTIVE:   SUBJECTIVE STATEMENT: I feel like I am getting better, moving better  Patient  reports that she has had issues since a lateral meniscus tear in 2018, she underwent a left TKA on 11/21/23.  She reports doing okay since surgery.  PERTINENT HISTORY: Pre diabetes PAIN:  Are you having pain? Yes: NPRS scale: 1/10 Pain location: left knee Pain description: ache Aggravating factors: getting up from sleep, standing and bending raising leg up, pain is up to 7/10 Relieving factors: pain meds, rest, ice , elevation at best a 2/10  PRECAUTIONS: None  RED FLAGS: None   WEIGHT BEARING RESTRICTIONS: No  FALLS:  Has patient fallen in last 6 months? No  LIVING ENVIRONMENT: Lives with: lives with their family Lives in: House/apartment Stairs: 3 steps to car Has following equipment at home: [name] - 2 wheeled  OCCUPATION: realtor. Stairs, walking  PLOF: Independent and yard work, house work, has a 22 and 51 year old  PATIENT GOALS: walk without any issues kick soccer ball, like to go on a hike. Ride a bike  NEXT MD VISIT: 12/05/23  OBJECTIVE:  Note: Objective measures were completed at Evaluation unless otherwise noted.  DIAGNOSTIC FINDINGS: ne  PATIENT SURVEYS:  LEFS = 8.8%  01/04/24 LEFS 39%  COGNITION: Overall cognitive status: Within functional limits for tasks assessed     SENSATION: WFL  EDEMA:  Circumferential: right mid patella 38 cm, 44 cm  left mid patella  MUSCLE LENGTH: Tight HS and calves  POSTURE: rounded shoulders and forward head  PALPATION: Mild tenderness to the left knee, the left quad is extremely tight, tightness in the ITB and the left buttock as well, she could not relax, worried about this being a significant habit that could cause hip/back issues  LOWER EXTREMITY ROM:  Active ROM AROM  LEft eval PROM Left eval AROM 11/27/23 AROM Left 12/07/23 A/PROM 12/21/23 A/PROM 01/04/24 AROM 01/11/24  Hip flexion         Hip extension         Hip abduction         Hip adduction         Hip internal rotation         Hip external  rotation         Knee flexion 90 96 92 92 102/106 106/110 111  Knee extension 60 28 19 13  12/7 10/0   Ankle dorsiflexion         Ankle plantarflexion         Ankle inversion         Ankle eversion          (Blank rows = not tested)  LOWER EXTREMITY MMT:  very poor quads, could not lift leg off the floor, pain in the patella   LOWER EXTREMITY SPECIAL TESTS:  Knee special tests: quad lag  FUNCTIONAL TESTS:  Timed up and go (TUG): 22 seconds with walker  01/04/24 TUG 12 seconds no device   GAIT: Distance walked: 100 feet Assistive device utilized: Environmental consultant - 2 wheeled Level of assistance: SBA Comments: Patient does not straighten the leg, tends to not bear full weight on the left leg and uses her arms for most weight bearing, she does stick her bottom out and leans way forward, I feel that she is doing this due to pain and fear of quad activation, as noted above she could not lift the leg due to pain in the patella with quad activation                                                                                                                                TREATMENT DATE:  01/11/24 Bike full revs without any trunk lean x 6 minutes Calf stretches LEg press 20# various angles, then no weight as low as she can go Leg curls 20# 2x10 Leg extension 5# 2x10 Gait outside steep slopes up and down, uneven terrain Passive stretch  01/04/24 Bike partial revs working to full revolutions with some trunk lean.   Gait outside working on form, step length and then stairs step over step with handrails PROM of the left knee flexion and extension STM to the left calf and HS, scar and patellar mobilizations Calf stretches LEg press working on going low to gain flexion TUG as noted above 12 seconds  without device and did LEFS with her  SAQ 4# with cues for TKE Sit to stand without hands working on no trunk lean  01/01/24 Bike partial revs x 6 minutes, tried some full revs with trunk  lean Outside gait working on speed and form, up and down stairs step over step with CGA and handrail HS curls 2x10 25# LEg press 20# at level 3 and 4 with one hand width from toe to the top of foot plate, then same with no weight down to the bottom SAQ 5# 2x10 Feet on ball K2C, bridges, isometric abs Passive stretch into flexion and extension  12/28/23 Nustep level 4 LE only x 4 minutes Bike partial revs 4 minutes LEg press 20# working on flexion 40# resisted gait all directions 25# HS curls 5# leg extension Tmill pushes STM, scar mob, joint mobs  Passive stretch flexion and extension  12/25/23 Nustep level 5 x 6 minutes Gait outside around the parking [name] good pace working on posture with this with some verbal and tactile cues Bike partial revs x 4 minutes LEg curls 15# 2x10  Airex marching Left foot on 6 step and doing step downs  6 step up fwd and side Leg press 20# seat position #4 and #3 40# resisted gait all directions T mill pushes fwd and backward Passive stretch into flexion and extension  12/21/23 Gait outside, practice curbs, HHA and then some stairs trying to step down and go up stressing the surgical knee Nustep level 5 x 5 minutes Worked on gait mechanics with natural bend, less hip motions Leg press various angles working on flexion, toes one hand width from the top and going down to position 3 Passive stretch flexion and extension Lunge step stretch Scar mobs, joint mobs and joint distraction Seated self PROM knee flexion stretch ROM measured as noted above  12/18/23: Seated for L knee flexion stretch, supine for patellar mobs, deep cross friction massage distal L quads, retrograde massage med and lat knee jt line Supine for distraction L lower leg, combined with quad sets to improve L knee extension Rom Prone for TKE's with bolster under ankles  Prone for L knee flexion stretch, contract/ relax technique end range Supine for L ant hip quads stretch L  LE off table for contract/ relax  Standing B heel drops , 15 sec holds, with heel raises Lunges on step L LE to improve L knee flexion ROM Kinesiotaping L ant knee, 2 I pieces, surrounding patella to improve positioning of patella and reduce soft tissue restrictions distal L quads  12/15/23 Passive stretches with Tgun, some LLLD, some contract relax, did both flexion and extension Joint distraction and mobilization Feet on ball K2C, rotation, bridge, isometric abs Ball b/n knees squeeze with bridge SAQ 2.5# 3 x10 Prone knee flexion stretch Leg press no weight working on flexion  down to position #4 Gait with cues for heel strike and a natural bend with toe off, tends to rotate left hip posteriorly  12/11/23 Passive knee flexion stretch, some use of the Tgun to allow less pain and more ROM, STM, scar mob and patellar mobs Joint distraction REally worked multiple ways on getting flexion stretch as well as extension. Went over ways to do at home for low load long duration stretches Then we really worked on normalizing her gait more natural bend at toe off and decreasing left hip backward rotation, walked with her outside with focus on this Slant board stretch 2 step downs left foot staying  on step, also tried 4  12/07/23 L knee PROM w/ end range holds   L knee patella mobs  Leg press 40lb 2x10, LLE 20lb 2x5  LAQ 3lb 2x10 HS curls green 2x10 Sit to stands holding yellow ball 6in step up s  12/04/23 NuStep L 5 x 6 min L knee PROM w/ end range holds   L knee patella mobs  Sit to stand 2x10 LAQ RLE 5lb 2x10 HS curls green 2x10 Heel raises 2x15 Leg press 40lb 2x10   PATIENT EDUCATION:  Education details: POC/HEP Person educated: Patient Education method: Explanation, Demonstration, Tactile cues, Verbal cues, and Handouts Education comprehension: verbalized understanding, returned demonstration, verbal cues required, tactile cues required, and needs further education  HOME  EXERCISE PROGRAM: Access Code: [email or web address] URL: [email or web address] Date: 11/23/2023 Prepared by: Ozell Mainland  Exercises - Supine Quad Set  - 2 x daily - 7 x weekly - 2 sets - 10 reps - 3 hold - Supine Short Arc Quad  - 2 x daily - 7 x weekly - 2 sets - 10 reps - 3 hold - Supine Heel Slide  - 2 x daily - 7 x weekly - 1 sets - 10 reps - 10 hold - Seated Hamstring Stretch with Chair  - 2 x daily - 7 x weekly - 2 sets - 5 reps - 30 hold  ASSESSMENT:  CLINICAL IMPRESSION: Doing great able to make full revolutions today on the bike, she started out with trunk lean but then this smoothed out.  She was able to walk up and down steep slopes CGA needed some.  AROM is still improving  Patient is a 53 y.o. female who was seen today for physical therapy treatment for s/p left TKA.  She had surgery 9/2, she is very guarded in the left quad, hip area.  She has very poor quad extension.  With ambulation she has the knee in flexion with a forward flexed posture due to decrease TKE.   OBJECTIVE IMPAIRMENTS: Abnormal gait, cardiopulmonary status limiting activity, decreased activity tolerance, decreased balance, decreased coordination, decreased endurance, decreased mobility, difficulty walking, decreased ROM, decreased strength, increased edema, increased fascial restrictions, increased muscle spasms, impaired flexibility, improper body mechanics, postural dysfunction, and pain.   REHAB POTENTIAL: Good  CLINICAL DECISION MAKING: Evolving/moderate complexity  EVALUATION COMPLEXITY: Low   GOALS: Goals reviewed with patient? Yes  SHORT TERM GOALS: Target date: 12/14/23 Independent with initial HEP Baseline: Goal status: Met 11/27/23  LONG TERM GOALS: Target date: 02/22/24  Independent with advanced HEP Baseline:  Goal status: progressing 01/04/24  2.  Decrease pain overall 50% for higher quality of life Baseline:  Goal status: progressing 12/21/23 progressing 01/04/24 still some  cramping  3.  Increase left knee AROM to 0-120 degrees flexion for increase functional gait and independence Baseline:  Goal status:progressing 110/16/25 10-106 degrees flexion  4.  Decrease TUG time to 11 seconds for functional gait Baseline:  Goal status: progressing 12 seconds no device  5.  Be able to go up and down stairs step over step without device Baseline:  Goal status:ongoing 12/15/23, able to do on 01/04/24 but still some difficulty  6.  Ambulate all distances without device with minimal deviations of gait Baseline:  Goal status: progressing 12/21/23, not using device, 01/04/24 not using device still has a slight bend in the knee with heel strike and has difficulty going down slopes   PLAN:  PT FREQUENCY: 2x/week  PT DURATION: 12 weeks  PLANNED INTERVENTIONS: 97164- PT Re-evaluation,  97110-Therapeutic exercises, 97530- Therapeutic activity, [id number]- Neuromuscular re-education, [phone]- Self Care, [id number]- Manual therapy, [phone]- Gait training, [phone]- Vasopneumatic device, Patient/Family education, Balance training, Stair training, Joint mobilization, and Cryotherapy  PLAN FOR NEXT SESSION: push ROM and better gait, higher level activities 1 of 6 visits, will plan to maximize by the time she sees the MD on 02/06/24   Perl Folmar W, PT,  01/11/2024, 1:21 PM

## 2024-01-12 ENCOUNTER — Other Ambulatory Visit (HOSPITAL_COMMUNITY): Payer: Self-pay

## 2024-01-12 MED ORDER — TIRZEPATIDE 15 MG/0.5ML ~~LOC~~ SOAJ
15.0000 mg | SUBCUTANEOUS | 5 refills | Status: DC
  Filled 2024-01-12: qty 2, 28d supply, fill #0

## 2024-01-15 ENCOUNTER — Encounter: Payer: Self-pay | Admitting: Physical Therapy

## 2024-01-15 ENCOUNTER — Ambulatory Visit: Admitting: Physical Therapy

## 2024-01-15 DIAGNOSIS — R600 Localized edema: Secondary | ICD-10-CM

## 2024-01-15 DIAGNOSIS — M25562 Pain in left knee: Secondary | ICD-10-CM

## 2024-01-15 DIAGNOSIS — M25662 Stiffness of left knee, not elsewhere classified: Secondary | ICD-10-CM

## 2024-01-15 DIAGNOSIS — R262 Difficulty in walking, not elsewhere classified: Secondary | ICD-10-CM

## 2024-01-15 NOTE — Therapy (Signed)
 OUTPATIENT PHYSICAL THERAPY LOWER EXTREMITY TREATMENT   Patient Name: Tamara Bender MRN: [id number] DOB:08/31/1970, 53 y.o., female Today's Date: 01/15/2024  END OF SESSION:  PT End of Session - 01/15/24 1320     Visit Number 15    Date for Recertification  03/10/24    Authorization Type [employer] 2 of 6    PT Start Time 1316    PT Stop Time 1400    PT Time Calculation (min) 44 min    Activity Tolerance Patient tolerated treatment well    Behavior During Therapy WFL for tasks assessed/performed           Past Medical History:  Diagnosis Date   Allergy    Anxiety    Eczema    as a child,- 32 yo -- no current problems   Hypothyroid    PONV (postoperative nausea and vomiting)    Pre-diabetes    no meds - diet controlled   Past Surgical History:  Procedure Laterality Date   CESAREAN SECTION  2012, 2016   x 2   DILATION AND CURETTAGE OF UTERUS     x 2 - [facility]   DILATION AND CURETTAGE OF UTERUS     for 3rd miscarriage    DILATION AND EVACUATION N/A 04/18/2017   Procedure: DILATATION AND EVACUATION;  Surgeon: Alger Gong, MD;  Location: [facility];  Service: Gynecology;  Laterality: N/A;   KNEE CARTILAGE SURGERY Bilateral 1998, 2018   1998 left, 2018 right   LAPAROSCOPY  2013   blocked fallopian tube   WISDOM TOOTH EXTRACTION     Patient Active Problem List   Diagnosis Date Noted   Missed abortion    Mild persistent asthma without complication 01/05/2016   Other allergic rhinitis 01/05/2016   Acute non-recurrent maxillary sinusitis 01/05/2016    PCP: Andrew, MD  REFERRING PROVIDER: Aluisio, MD  REFERRING DIAG: s/p left TKA  THERAPY DIAG:  Stiffness of left knee, not elsewhere classified  Localized edema  Difficulty in walking, not elsewhere classified  Acute pain of left knee  Rationale for Evaluation and Treatment: Rehabilitation  ONSET DATE: 11/21/23  SUBJECTIVE:   SUBJECTIVE STATEMENT: Doing pretty good.  I am still a little sore, I try to take  less pain medicine (Aleve).  But I really still hurt.  Patient reports that she has had issues since a lateral meniscus tear in 2018, she underwent a left TKA on 11/21/23.  She reports doing okay since surgery.  PERTINENT HISTORY: Pre diabetes PAIN:  Are you having pain? Yes: NPRS scale: 1/10 Pain location: left knee Pain description: ache Aggravating factors: getting up from sleep, standing and bending raising leg up, pain is up to 7/10 Relieving factors: pain meds, rest, ice , elevation at best a 2/10  PRECAUTIONS: None  RED FLAGS: None   WEIGHT BEARING RESTRICTIONS: No  FALLS:  Has patient fallen in last 6 months? No  LIVING ENVIRONMENT: Lives with: lives with their family Lives in: House/apartment Stairs: 3 steps to car Has following equipment at home: [name] - 2 wheeled  OCCUPATION: realtor. Stairs, walking  PLOF: Independent and yard work, house work, has a 27 and 51 year old  PATIENT GOALS: walk without any issues kick soccer ball, like to go on a hike. Ride a bike  NEXT MD VISIT: 12/05/23  OBJECTIVE:  Note: Objective measures were completed at Evaluation unless otherwise noted.  DIAGNOSTIC FINDINGS: ne  PATIENT SURVEYS:  LEFS = 8.8%  01/04/24 LEFS 39%  COGNITION: Overall cognitive status:  Within functional limits for tasks assessed     SENSATION: WFL  EDEMA:  Circumferential: right mid patella 38 cm, 44 cm  left mid patella  MUSCLE LENGTH: Tight HS and calves  POSTURE: rounded shoulders and forward head  PALPATION: Mild tenderness to the left knee, the left quad is extremely tight, tightness in the ITB and the left buttock as well, she could not relax, worried about this being a significant habit that could cause hip/back issues  LOWER EXTREMITY ROM:  Active ROM AROM  LEft eval PROM Left eval AROM 11/27/23 AROM Left 12/07/23 A/PROM 12/21/23 A/PROM 01/04/24 AROM 01/11/24  Hip flexion         Hip extension         Hip abduction         Hip  adduction         Hip internal rotation         Hip external rotation         Knee flexion 90 96 92 92 102/106 106/110 111  Knee extension 60 28 19 13  12/7 10/0   Ankle dorsiflexion         Ankle plantarflexion         Ankle inversion         Ankle eversion          (Blank rows = not tested)  LOWER EXTREMITY MMT:  very poor quads, could not lift leg off the floor, pain in the patella   LOWER EXTREMITY SPECIAL TESTS:  Knee special tests: quad lag  FUNCTIONAL TESTS:  Timed up and go (TUG): 22 seconds with walker  01/04/24 TUG 12 seconds no device   GAIT: Distance walked: 100 feet Assistive device utilized: [employer] - 2 wheeled Level of assistance: SBA Comments: Patient does not straighten the leg, tends to not bear full weight on the left leg and uses her arms for most weight bearing, she does stick her bottom out and leans way forward, I feel that she is doing this due to pain and fear of quad activation, as noted above she could not lift the leg due to pain in the patella with quad activation                                                                                                                               TREATMENT DATE:  01/15/24 Leg curls 25# 2x10 Leg ext 5# 2x10 Nustep level 5 LE only x 5 minutes Leg press 20# going as low as possible and then with no weight Bike level 4 full revs x 5 minutes Slant board calf stretch Feet on ball K2C, rotation, bridge, isometric abs PAssive stretch left knee flexion and extension   01/11/24 Bike full revs without any trunk lean x 6 minutes Calf stretches LEg press 20# various angles, then no weight as low as she can go Leg curls 20# 2x10 Leg extension 5# 2x10 Gait outside steep slopes  up and down, uneven terrain Passive stretch  01/04/24 Bike partial revs working to full revolutions with some trunk lean.   Gait outside working on form, step length and then stairs step over step with handrails PROM of the left knee  flexion and extension STM to the left calf and HS, scar and patellar mobilizations Calf stretches LEg press working on going low to gain flexion TUG as noted above 12 seconds without device and did LEFS with her  SAQ 4# with cues for TKE Sit to stand without hands working on no trunk lean  01/01/24 Bike partial revs x 6 minutes, tried some full revs with trunk lean Outside gait working on speed and form, up and down stairs step over step with CGA and handrail HS curls 2x10 25# LEg press 20# at level 3 and 4 with one hand width from toe to the top of foot plate, then same with no weight down to the bottom SAQ 5# 2x10 Feet on ball K2C, bridges, isometric abs Passive stretch into flexion and extension  12/28/23 Nustep level 4 LE only x 4 minutes Bike partial revs 4 minutes LEg press 20# working on flexion 40# resisted gait all directions 25# HS curls 5# leg extension Tmill pushes STM, scar mob, joint mobs  Passive stretch flexion and extension  12/25/23 Nustep level 5 x 6 minutes Gait outside around the parking [email or web address] good pace working on posture with this with some verbal and tactile cues Bike partial revs x 4 minutes LEg curls 15# 2x10  Airex marching Left foot on 6 step and doing step downs  6 step up fwd and side Leg press 20# seat position #4 and #3 40# resisted gait all directions T mill pushes fwd and backward Passive stretch into flexion and extension  12/21/23 Gait outside, practice curbs, HHA and then some stairs trying to step down and go up stressing the surgical knee Nustep level 5 x 5 minutes Worked on gait mechanics with natural bend, less hip motions Leg press various angles working on flexion, toes one hand width from the top and going down to position 3 Passive stretch flexion and extension Lunge step stretch Scar mobs, joint mobs and joint distraction Seated self PROM knee flexion stretch ROM measured as noted above  12/18/23: Seated for L knee  flexion stretch, supine for patellar mobs, deep cross friction massage distal L quads, retrograde massage med and lat knee jt line Supine for distraction L lower leg, combined with quad sets to improve L knee extension Rom Prone for TKE's with bolster under ankles  Prone for L knee flexion stretch, contract/ relax technique end range Supine for L ant hip quads stretch L LE off table for contract/ relax  Standing B heel drops , 15 sec holds, with heel raises Lunges on step L LE to improve L knee flexion ROM Kinesiotaping L ant knee, 2 I pieces, surrounding patella to improve positioning of patella and reduce soft tissue restrictions distal L quads  12/15/23 Passive stretches with Tgun, some LLLD, some contract relax, did both flexion and extension Joint distraction and mobilization Feet on ball K2C, rotation, bridge, isometric abs Ball b/n knees squeeze with bridge SAQ 2.5# 3 x10 Prone knee flexion stretch Leg press no weight working on flexion  down to position #4 Gait with cues for heel strike and a natural bend with toe off, tends to rotate left hip posteriorly  12/11/23 Passive knee flexion stretch, some use of the Tgun to allow less pain  and more ROM, STM, scar mob and patellar mobs Joint distraction REally worked multiple ways on getting flexion stretch as well as extension. Went over ways to do at home for low load long duration stretches Then we really worked on normalizing her gait more natural bend at toe off and decreasing left hip backward rotation, walked with her outside with focus on this Slant board stretch 2 step downs left foot staying on step, also tried 4  12/07/23 L knee PROM w/ end range holds   L knee patella mobs  Leg press 40lb 2x10, LLE 20lb 2x5  LAQ 3lb 2x10 HS curls green 2x10 Sit to stands holding yellow ball 6in step up s  12/04/23 NuStep L 5 x 6 min L knee PROM w/ end range holds   L knee patella mobs  Sit to stand 2x10 LAQ RLE 5lb 2x10 HS curls  green 2x10 Heel raises 2x15 Leg press 40lb 2x10   PATIENT EDUCATION:  Education details: POC/HEP Person educated: Patient Education method: Explanation, Demonstration, Tactile cues, Verbal cues, and Handouts Education comprehension: verbalized understanding, returned demonstration, verbal cues required, tactile cues required, and needs further education  HOME EXERCISE PROGRAM: Access Code: [email or web address] URL: [email or web address] Date: 11/23/2023 Prepared by: Ozell Mainland  Exercises - Supine Quad Set  - 2 x daily - 7 x weekly - 2 sets - 10 reps - 3 hold - Supine Short Arc Quad  - 2 x daily - 7 x weekly - 2 sets - 10 reps - 3 hold - Supine Heel Slide  - 2 x daily - 7 x weekly - 1 sets - 10 reps - 10 hold - Seated Hamstring Stretch with Chair  - 2 x daily - 7 x weekly - 2 sets - 5 reps - 30 hold  ASSESSMENT:  CLINICAL IMPRESSION: Doing well, only taking Aleve, still with pain, she is walking better and had no issue with full revs on the bike  Patient is a 53 y.o. female who was seen today for physical therapy treatment for s/p left TKA.  She had surgery 9/2, she is very guarded in the left quad, hip area.  She has very poor quad extension.  With ambulation she has the knee in flexion with a forward flexed posture due to decrease TKE.   OBJECTIVE IMPAIRMENTS: Abnormal gait, cardiopulmonary status limiting activity, decreased activity tolerance, decreased balance, decreased coordination, decreased endurance, decreased mobility, difficulty walking, decreased ROM, decreased strength, increased edema, increased fascial restrictions, increased muscle spasms, impaired flexibility, improper body mechanics, postural dysfunction, and pain.   REHAB POTENTIAL: Good  CLINICAL DECISION MAKING: Evolving/moderate complexity  EVALUATION COMPLEXITY: Low   GOALS: Goals reviewed with patient? Yes  SHORT TERM GOALS: Target date: 12/14/23 Independent with initial HEP Baseline: Goal  status: Met 11/27/23  LONG TERM GOALS: Target date: 02/22/24  Independent with advanced HEP Baseline:  Goal status: progressing 01/04/24  2.  Decrease pain overall 50% for higher quality of life Baseline:  Goal status: progressing 12/21/23 progressing 01/04/24 still some cramping  3.  Increase left knee AROM to 0-120 degrees flexion for increase functional gait and independence Baseline:  Goal status:progressing 110/16/25 10-106 degrees flexion  4.  Decrease TUG time to 11 seconds for functional gait Baseline:  Goal status: progressing 12 seconds no device  5.  Be able to go up and down stairs step over step without device Baseline:  Goal status:ongoing 12/15/23, able to do on 01/04/24 but still some difficulty  6.  Ambulate all distances without  device with minimal deviations of gait Baseline:  Goal status: progressing 12/21/23, not using device, 01/04/24 not using device still has a slight bend in the knee with heel strike and has difficulty going down slopes   PLAN:  PT FREQUENCY: 2x/week  PT DURATION: 12 weeks  PLANNED INTERVENTIONS: 97164- PT Re-evaluation, 97110-Therapeutic exercises, 97530- Therapeutic activity, 97112- Neuromuscular re-education, 97535- Self Care, [id number]- Manual therapy, [phone]- Gait training, [phone]- Vasopneumatic device, Patient/Family education, Balance training, Stair training, Joint mobilization, and Cryotherapy  PLAN FOR NEXT SESSION: push ROM and better gait, higher level activities 2 of 6 visits, will plan to maximize by the time she sees the MD on 02/06/24   Norinne Jeane W, PT,  01/15/2024, 1:21 PM

## 2024-01-18 ENCOUNTER — Ambulatory Visit: Admitting: Physical Therapy

## 2024-01-18 ENCOUNTER — Encounter: Payer: Self-pay | Admitting: Physical Therapy

## 2024-01-18 DIAGNOSIS — M25662 Stiffness of left knee, not elsewhere classified: Secondary | ICD-10-CM | POA: Diagnosis not present

## 2024-01-18 DIAGNOSIS — R600 Localized edema: Secondary | ICD-10-CM

## 2024-01-18 DIAGNOSIS — M25562 Pain in left knee: Secondary | ICD-10-CM

## 2024-01-18 DIAGNOSIS — R262 Difficulty in walking, not elsewhere classified: Secondary | ICD-10-CM

## 2024-01-18 NOTE — Therapy (Signed)
 OUTPATIENT PHYSICAL THERAPY LOWER EXTREMITY TREATMENT   Patient Name: Tamara Bender MRN: [id number] DOB:08-15-1970, 53 y.o., female Today's Date: 01/18/2024  END OF SESSION:  PT End of Session - 01/18/24 1326     Visit Number 16    Date for Recertification  03/10/24    Authorization Type BC[employer] 3 of 6    PT Start Time 1320    PT Stop Time 1400    PT Time Calculation (min) 40 min    Activity Tolerance Patient tolerated treatment well    Behavior During Therapy WFL for tasks assessed/performed           Past Medical History:  Diagnosis Date   Allergy    Anxiety    Eczema    as a child,- 52 yo -- no current problems   Hypothyroid    PONV (postoperative nausea and vomiting)    Pre-diabetes    no meds - diet controlled   Past Surgical History:  Procedure Laterality Date   CESAREAN SECTION  2012, 2016   x 2   DILATION AND CURETTAGE OF UTERUS     x 2 - [facility]   DILATION AND CURETTAGE OF UTERUS     for 3rd miscarriage    DILATION AND EVACUATION N/A 04/18/2017   Procedure: DILATATION AND EVACUATION;  Surgeon: Alger Gong, MD;  Location: [facility];  Service: Gynecology;  Laterality: N/A;   KNEE CARTILAGE SURGERY Bilateral 1998, 2018   1998 left, 2018 right   LAPAROSCOPY  2013   blocked fallopian tube   WISDOM TOOTH EXTRACTION     Patient Active Problem List   Diagnosis Date Noted   Missed abortion    Mild persistent asthma without complication 01/05/2016   Other allergic rhinitis 01/05/2016   Acute non-recurrent maxillary sinusitis 01/05/2016    PCP: Andrew, MD  REFERRING PROVIDER: Aluisio, MD  REFERRING DIAG: s/p left TKA  THERAPY DIAG:  Stiffness of left knee, not elsewhere classified  Localized edema  Difficulty in walking, not elsewhere classified  Acute pain of left knee  Rationale for Evaluation and Treatment: Rehabilitation  ONSET DATE: 11/21/23  SUBJECTIVE:   SUBJECTIVE STATEMENT: Doing pretty good.  No real isues  Patient reports that she  has had issues since a lateral meniscus tear in 2018, she underwent a left TKA on 11/21/23.  She reports doing okay since surgery.  PERTINENT HISTORY: Pre diabetes PAIN:  Are you having pain? Yes: NPRS scale: 1/10 Pain location: left knee Pain description: ache Aggravating factors: getting up from sleep, standing and bending raising leg up, pain is up to 7/10 Relieving factors: pain meds, rest, ice , elevation at best a 2/10  PRECAUTIONS: None  RED FLAGS: None   WEIGHT BEARING RESTRICTIONS: No  FALLS:  Has patient fallen in last 6 months? No  LIVING ENVIRONMENT: Lives with: lives with their family Lives in: House/apartment Stairs: 3 steps to car Has following equipment at home: [name] - 2 wheeled  OCCUPATION: realtor. Stairs, walking  PLOF: Independent and yard work, house work, has a 77 and 51 year old  PATIENT GOALS: walk without any issues kick soccer ball, like to go on a hike. Ride a bike  NEXT MD VISIT: 12/05/23  OBJECTIVE:  Note: Objective measures were completed at Evaluation unless otherwise noted.  DIAGNOSTIC FINDINGS: ne  PATIENT SURVEYS:  LEFS = 8.8%  01/04/24 LEFS 39%  COGNITION: Overall cognitive status: Within functional limits for tasks assessed     SENSATION: WFL  EDEMA:  Circumferential: right  mid patella 38 cm, 44 cm  left mid patella  MUSCLE LENGTH: Tight HS and calves  POSTURE: rounded shoulders and forward head  PALPATION: Mild tenderness to the left knee, the left quad is extremely tight, tightness in the ITB and the left buttock as well, she could not relax, worried about this being a significant habit that could cause hip/back issues  LOWER EXTREMITY ROM:  Active ROM AROM  LEft eval PROM Left eval AROM 11/27/23 AROM Left 12/07/23 A/PROM 12/21/23 A/PROM 01/04/24 AROM 01/11/24 AROM 01/18/24  Hip flexion          Hip extension          Hip abduction          Hip adduction          Hip internal rotation          Hip external  rotation          Knee flexion 90 96 92 92 102/106 106/110 111 115  Knee extension 60 28 19 13  12/7 10/0  10  Ankle dorsiflexion          Ankle plantarflexion          Ankle inversion          Ankle eversion           (Blank rows = not tested)  LOWER EXTREMITY MMT:  very poor quads, could not lift leg off the floor, pain in the patella   LOWER EXTREMITY SPECIAL TESTS:  Knee special tests: quad lag  FUNCTIONAL TESTS:  Timed up and go (TUG): 22 seconds with walker  01/04/24 TUG 12 seconds no device   GAIT: Distance walked: 100 feet Assistive device utilized: [employer] - 2 wheeled Level of assistance: SBA Comments: Patient does not straighten the leg, tends to not bear full weight on the left leg and uses her arms for most weight bearing, she does stick her bottom out and leans way forward, I feel that she is doing this due to pain and fear of quad activation, as noted above she could not lift the leg due to pain in the patella with quad activation                                                                                                                               TREATMENT DATE:  01/18/24 Bike eat position 4 and 5 full revs. Level 4 x 6 minutes Gait outside brisk pace, uneven terrain. LEg press 20# low Bosu reaching Leg curls 25# Leg ext 5# Passive stretch LE's really tried to figure out her gait with the stretches, she appears to have tight hip flexors but I think it is calves and HS  01/15/24 Leg curls 25# 2x10 Leg ext 5# 2x10 Nustep level 5 LE only x 5 minutes Leg press 20# going as low as possible and then with no weight Bike level 4 full revs x 5 minutes Slant board  calf stretch Feet on ball K2C, rotation, bridge, isometric abs PAssive stretch left knee flexion and extension   01/11/24 Bike full revs without any trunk lean x 6 minutes Calf stretches LEg press 20# various angles, then no weight as low as she can go Leg curls 20# 2x10 Leg extension 5#  2x10 Gait outside steep slopes up and down, uneven terrain Passive stretch  01/04/24 Bike partial revs working to full revolutions with some trunk lean.   Gait outside working on form, step length and then stairs step over step with handrails PROM of the left knee flexion and extension STM to the left calf and HS, scar and patellar mobilizations Calf stretches LEg press working on going low to gain flexion TUG as noted above 12 seconds without device and did LEFS with her  SAQ 4# with cues for TKE Sit to stand without hands working on no trunk lean  01/01/24 Bike partial revs x 6 minutes, tried some full revs with trunk lean Outside gait working on speed and form, up and down stairs step over step with CGA and handrail HS curls 2x10 25# LEg press 20# at level 3 and 4 with one hand width from toe to the top of foot plate, then same with no weight down to the bottom SAQ 5# 2x10 Feet on ball K2C, bridges, isometric abs Passive stretch into flexion and extension  12/28/23 Nustep level 4 LE only x 4 minutes Bike partial revs 4 minutes LEg press 20# working on flexion 40# resisted gait all directions 25# HS curls 5# leg extension Tmill pushes STM, scar mob, joint mobs  Passive stretch flexion and extension  12/25/23 Nustep level 5 x 6 minutes Gait outside around the parking [email or web address] good pace working on posture with this with some verbal and tactile cues Bike partial revs x 4 minutes LEg curls 15# 2x10  Airex marching Left foot on 6 step and doing step downs  6 step up fwd and side Leg press 20# seat position #4 and #3 40# resisted gait all directions T mill pushes fwd and backward Passive stretch into flexion and extension  12/21/23 Gait outside, practice curbs, HHA and then some stairs trying to step down and go up stressing the surgical knee Nustep level 5 x 5 minutes Worked on gait mechanics with natural bend, less hip motions Leg press various angles working on  flexion, toes one hand width from the top and going down to position 3 Passive stretch flexion and extension Lunge step stretch Scar mobs, joint mobs and joint distraction Seated self PROM knee flexion stretch ROM measured as noted above  12/18/23: Seated for L knee flexion stretch, supine for patellar mobs, deep cross friction massage distal L quads, retrograde massage med and lat knee jt line Supine for distraction L lower leg, combined with quad sets to improve L knee extension Rom Prone for TKE's with bolster under ankles  Prone for L knee flexion stretch, contract/ relax technique end range Supine for L ant hip quads stretch L LE off table for contract/ relax  Standing B heel drops , 15 sec holds, with heel raises Lunges on step L LE to improve L knee flexion ROM Kinesiotaping L ant knee, 2 I pieces, surrounding patella to improve positioning of patella and reduce soft tissue restrictions distal L quads  12/15/23 Passive stretches with Tgun, some LLLD, some contract relax, did both flexion and extension Joint distraction and mobilization Feet on ball K2C, rotation, bridge, isometric abs Ball b/n  knees squeeze with bridge SAQ 2.5# 3 x10 Prone knee flexion stretch Leg press no weight working on flexion  down to position #4 Gait with cues for heel strike and a natural bend with toe off, tends to rotate left hip posteriorly  12/11/23 Passive knee flexion stretch, some use of the Tgun to allow less pain and more ROM, STM, scar mob and patellar mobs Joint distraction REally worked multiple ways on getting flexion stretch as well as extension. Went over ways to do at home for low load long duration stretches Then we really worked on normalizing her gait more natural bend at toe off and decreasing left hip backward rotation, walked with her outside with focus on this Slant board stretch 2 step downs left foot staying on step, also tried 4  12/07/23 L knee PROM w/ end range holds   L  knee patella mobs  Leg press 40lb 2x10, LLE 20lb 2x5  LAQ 3lb 2x10 HS curls green 2x10 Sit to stands holding yellow ball 6in step up s  12/04/23 NuStep L 5 x 6 min L knee PROM w/ end range holds   L knee patella mobs  Sit to stand 2x10 LAQ RLE 5lb 2x10 HS curls green 2x10 Heel raises 2x15 Leg press 40lb 2x10   PATIENT EDUCATION:  Education details: POC/HEP Person educated: Patient Education method: Explanation, Demonstration, Tactile cues, Verbal cues, and Handouts Education comprehension: verbalized understanding, returned demonstration, verbal cues required, tactile cues required, and needs further education  HOME EXERCISE PROGRAM: Access Code: [email or web address] URL: [email or web address] Date: 11/23/2023 Prepared by: Ozell Mainland  Exercises - Supine Quad Set  - 2 x daily - 7 x weekly - 2 sets - 10 reps - 3 hold - Supine Short Arc Quad  - 2 x daily - 7 x weekly - 2 sets - 10 reps - 3 hold - Supine Heel Slide  - 2 x daily - 7 x weekly - 1 sets - 10 reps - 10 hold - Seated Hamstring Stretch with Chair  - 2 x daily - 7 x weekly - 2 sets - 5 reps - 30 hold  ASSESSMENT:  CLINICAL IMPRESSION: PAtient has a gait where her knees remain bent both sides, she also has flexed hips, tried to see what was causing this she has very flexible HS, she was pretty good with hip flexors, the issues seem to be the top of the gastroc and the insertion of the HS this was where she felt the most and is still lacking the TKE, Had her try some extension mobs at home  Patient is a 53 y.o. female who was seen today for physical therapy treatment for s/p left TKA.  She had surgery 9/2, she is very guarded in the left quad, hip area.  She has very poor quad extension.  With ambulation she has the knee in flexion with a forward flexed posture due to decrease TKE.   OBJECTIVE IMPAIRMENTS: Abnormal gait, cardiopulmonary status limiting activity, decreased activity tolerance, decreased balance,  decreased coordination, decreased endurance, decreased mobility, difficulty walking, decreased ROM, decreased strength, increased edema, increased fascial restrictions, increased muscle spasms, impaired flexibility, improper body mechanics, postural dysfunction, and pain.   REHAB POTENTIAL: Good  CLINICAL DECISION MAKING: Evolving/moderate complexity  EVALUATION COMPLEXITY: Low   GOALS: Goals reviewed with patient? Yes  SHORT TERM GOALS: Target date: 12/14/23 Independent with initial HEP Baseline: Goal status: Met 11/27/23  LONG TERM GOALS: Target date: 02/22/24  Independent with advanced HEP Baseline:  Goal status:  progressing 01/04/24  2.  Decrease pain overall 50% for higher quality of life Baseline:  Goal status: progressing 12/21/23 progressing 01/04/24 still some cramping  3.  Increase left knee AROM to 0-120 degrees flexion for increase functional gait and independence Baseline:  Goal status:progressing 110/16/25 10-106 degrees flexion  4.  Decrease TUG time to 11 seconds for functional gait Baseline:  Goal status: met 01/18/24  5.  Be able to go up and down stairs step over step without device Baseline:  Goal status:ongoing 12/15/23, able to do on 01/04/24 but still some difficulty  6.  Ambulate all distances without device with minimal deviations of gait Baseline:  Goal status: progressing 12/21/23, not using device, 01/04/24 not using device still has a slight bend in the knee with heel strike and has difficulty going down slopes   PLAN:  PT FREQUENCY: 2x/week  PT DURATION: 12 weeks  PLANNED INTERVENTIONS: 97164- PT Re-evaluation, 97110-Therapeutic exercises, 97530- Therapeutic activity, 97112- Neuromuscular re-education, 97535- Self Care, [id number]- Manual therapy, [phone]- Gait training, [phone]- Vasopneumatic device, Patient/Family education, Balance training, Stair training, Joint mobilization, and Cryotherapy  PLAN FOR NEXT SESSION: push ROM and better gait, higher  level activities 2 of 6 visits, will plan to maximize by the time she sees the MD on 02/06/24   OBADIAH OZELL ORN, PT,  01/18/2024, 1:26 PM

## 2024-01-22 ENCOUNTER — Encounter: Payer: Self-pay | Admitting: Physical Therapy

## 2024-01-22 ENCOUNTER — Ambulatory Visit: Attending: Orthopedic Surgery | Admitting: Physical Therapy

## 2024-01-22 DIAGNOSIS — M25562 Pain in left knee: Secondary | ICD-10-CM | POA: Diagnosis present

## 2024-01-22 DIAGNOSIS — R262 Difficulty in walking, not elsewhere classified: Secondary | ICD-10-CM | POA: Insufficient documentation

## 2024-01-22 DIAGNOSIS — M25662 Stiffness of left knee, not elsewhere classified: Secondary | ICD-10-CM | POA: Insufficient documentation

## 2024-01-22 DIAGNOSIS — R600 Localized edema: Secondary | ICD-10-CM | POA: Diagnosis present

## 2024-01-22 NOTE — Therapy (Signed)
 OUTPATIENT PHYSICAL THERAPY LOWER EXTREMITY TREATMENT   Patient Name: Tamara Bender MRN: [id number] DOB:02/27/1971, 53 y.o., female Today's Date: 01/22/2024  END OF SESSION:  PT End of Session - 01/22/24 1621     Visit Number 17    Date for Recertification  03/10/24    Authorization Type [employer] 4 of 6    PT Start Time 1617    PT Stop Time 1700    PT Time Calculation (min) 43 min    Activity Tolerance Patient tolerated treatment well    Behavior During Therapy WFL for tasks assessed/performed           Past Medical History:  Diagnosis Date   Allergy    Anxiety    Eczema    as a child,- 46 yo -- no current problems   Hypothyroid    PONV (postoperative nausea and vomiting)    Pre-diabetes    no meds - diet controlled   Past Surgical History:  Procedure Laterality Date   CESAREAN SECTION  2012, 2016   x 2   DILATION AND CURETTAGE OF UTERUS     x 2 - [facility]   DILATION AND CURETTAGE OF UTERUS     for 3rd miscarriage    DILATION AND EVACUATION N/A 04/18/2017   Procedure: DILATATION AND EVACUATION;  Surgeon: Alger Gong, MD;  Location: [facility];  Service: Gynecology;  Laterality: N/A;   KNEE CARTILAGE SURGERY Bilateral 1998, 2018   1998 left, 2018 right   LAPAROSCOPY  2013   blocked fallopian tube   WISDOM TOOTH EXTRACTION     Patient Active Problem List   Diagnosis Date Noted   Missed abortion    Mild persistent asthma without complication 01/05/2016   Other allergic rhinitis 01/05/2016   Acute non-recurrent maxillary sinusitis 01/05/2016    PCP: Andrew, MD  REFERRING PROVIDER: Melodi, MD  REFERRING DIAG: s/p left TKA  THERAPY DIAG:  Stiffness of left knee, not elsewhere classified  Localized edema  Difficulty in walking, not elsewhere classified  Acute pain of left knee  Rationale for Evaluation and Treatment: Rehabilitation  ONSET DATE: 11/21/23  SUBJECTIVE:   SUBJECTIVE STATEMENT: Feeling better, less pain, less cramps  Patient reports that  she has had issues since a lateral meniscus tear in 2018, she underwent a left TKA on 11/21/23.  She reports doing okay since surgery.  PERTINENT HISTORY: Pre diabetes PAIN:  Are you having pain? Yes: NPRS scale: 1/10 Pain location: left knee Pain description: ache Aggravating factors: getting up from sleep, standing and bending raising leg up, pain is up to 7/10 Relieving factors: pain meds, rest, ice , elevation at best a 2/10  PRECAUTIONS: None  RED FLAGS: None   WEIGHT BEARING RESTRICTIONS: No  FALLS:  Has patient fallen in last 6 months? No  LIVING ENVIRONMENT: Lives with: lives with their family Lives in: House/apartment Stairs: 3 steps to car Has following equipment at home: [name] - 2 wheeled  OCCUPATION: realtor. Stairs, walking  PLOF: Independent and yard work, house work, has a 39 and 51 year old  PATIENT GOALS: walk without any issues kick soccer ball, like to go on a hike. Ride a bike  NEXT MD VISIT: 12/05/23  OBJECTIVE:  Note: Objective measures were completed at Evaluation unless otherwise noted.  DIAGNOSTIC FINDINGS: ne  PATIENT SURVEYS:  LEFS = 8.8%  01/04/24 LEFS 39%  COGNITION: Overall cognitive status: Within functional limits for tasks assessed     SENSATION: WFL  EDEMA:  Circumferential: right mid  patella 38 cm, 44 cm  left mid patella  MUSCLE LENGTH: Tight HS and calves  POSTURE: rounded shoulders and forward head  PALPATION: Mild tenderness to the left knee, the left quad is extremely tight, tightness in the ITB and the left buttock as well, she could not relax, worried about this being a significant habit that could cause hip/back issues  LOWER EXTREMITY ROM:  Active ROM AROM  LEft eval PROM Left eval AROM 11/27/23 AROM Left 12/07/23 A/PROM 12/21/23 A/PROM 01/04/24 AROM 01/11/24 AROM 01/18/24  Hip flexion          Hip extension          Hip abduction          Hip adduction          Hip internal rotation          Hip external  rotation          Knee flexion 90 96 92 92 102/106 106/110 111 115  Knee extension 60 28 19 13  12/7 10/0  10  Ankle dorsiflexion          Ankle plantarflexion          Ankle inversion          Ankle eversion           (Blank rows = not tested)  LOWER EXTREMITY MMT:  very poor quads, could not lift leg off the floor, pain in the patella   LOWER EXTREMITY SPECIAL TESTS:  Knee special tests: quad lag  FUNCTIONAL TESTS:  Timed up and go (TUG): 22 seconds with walker  01/04/24 TUG 12 seconds no device   GAIT: Distance walked: 100 feet Assistive device utilized: [employer] - 2 wheeled Level of assistance: SBA Comments: Patient does not straighten the leg, tends to not bear full weight on the left leg and uses her arms for most weight bearing, she does stick her bottom out and leans way forward, I feel that she is doing this due to pain and fear of quad activation, as noted above she could not lift the leg due to pain in the patella with quad activation                                                                                                                               TREATMENT DATE:  01/22/24 Nustep level 5 x 6 minutes LE only Bike level 4 x 4 minutes LEg curls 25# Leg ext 5# Slant board stretch for calves Resisted walking  Leg press 20# Passive stretch flexion and extension   01/18/24 Bike eat position 4 and 5 full revs. Level 4 x 6 minutes Gait outside brisk pace, uneven terrain. LEg press 20# low Bosu reaching Leg curls 25# Leg ext 5# Passive stretch LE's really tried to figure out her gait with the stretches, she appears to have tight hip flexors but I think it is calves and HS  01/15/24 Leg  curls 25# 2x10 Leg ext 5# 2x10 Nustep level 5 LE only x 5 minutes Leg press 20# going as low as possible and then with no weight Bike level 4 full revs x 5 minutes Slant board calf stretch Feet on ball K2C, rotation, bridge, isometric abs PAssive stretch left knee flexion  and extension   01/11/24 Bike full revs without any trunk lean x 6 minutes Calf stretches LEg press 20# various angles, then no weight as low as she can go Leg curls 20# 2x10 Leg extension 5# 2x10 Gait outside steep slopes up and down, uneven terrain Passive stretch  01/04/24 Bike partial revs working to full revolutions with some trunk lean.   Gait outside working on form, step length and then stairs step over step with handrails PROM of the left knee flexion and extension STM to the left calf and HS, scar and patellar mobilizations Calf stretches LEg press working on going low to gain flexion TUG as noted above 12 seconds without device and did LEFS with her  SAQ 4# with cues for TKE Sit to stand without hands working on no trunk lean  01/01/24 Bike partial revs x 6 minutes, tried some full revs with trunk lean Outside gait working on speed and form, up and down stairs step over step with CGA and handrail HS curls 2x10 25# LEg press 20# at level 3 and 4 with one hand width from toe to the top of foot plate, then same with no weight down to the bottom SAQ 5# 2x10 Feet on ball K2C, bridges, isometric abs Passive stretch into flexion and extension  12/28/23 Nustep level 4 LE only x 4 minutes Bike partial revs 4 minutes LEg press 20# working on flexion 40# resisted gait all directions 25# HS curls 5# leg extension Tmill pushes STM, scar mob, joint mobs  Passive stretch flexion and extension  12/25/23 Nustep level 5 x 6 minutes Gait outside around the parking [email or web address] good pace working on posture with this with some verbal and tactile cues Bike partial revs x 4 minutes LEg curls 15# 2x10  Airex marching Left foot on 6 step and doing step downs  6 step up fwd and side Leg press 20# seat position #4 and #3 40# resisted gait all directions T mill pushes fwd and backward Passive stretch into flexion and extension  12/21/23 Gait outside, practice curbs, HHA and then some  stairs trying to step down and go up stressing the surgical knee Nustep level 5 x 5 minutes Worked on gait mechanics with natural bend, less hip motions Leg press various angles working on flexion, toes one hand width from the top and going down to position 3 Passive stretch flexion and extension Lunge step stretch Scar mobs, joint mobs and joint distraction Seated self PROM knee flexion stretch ROM measured as noted above  12/18/23: Seated for L knee flexion stretch, supine for patellar mobs, deep cross friction massage distal L quads, retrograde massage med and lat knee jt line Supine for distraction L lower leg, combined with quad sets to improve L knee extension Rom Prone for TKE's with bolster under ankles  Prone for L knee flexion stretch, contract/ relax technique end range Supine for L ant hip quads stretch L LE off table for contract/ relax  Standing B heel drops , 15 sec holds, with heel raises Lunges on step L LE to improve L knee flexion ROM Kinesiotaping L ant knee, 2 I pieces, surrounding patella to improve positioning of patella  and reduce soft tissue restrictions distal L quads  12/15/23 Passive stretches with Tgun, some LLLD, some contract relax, did both flexion and extension Joint distraction and mobilization Feet on ball K2C, rotation, bridge, isometric abs Ball b/n knees squeeze with bridge SAQ 2.5# 3 x10 Prone knee flexion stretch Leg press no weight working on flexion  down to position #4 Gait with cues for heel strike and a natural bend with toe off, tends to rotate left hip posteriorly  12/11/23 Passive knee flexion stretch, some use of the Tgun to allow less pain and more ROM, STM, scar mob and patellar mobs Joint distraction REally worked multiple ways on getting flexion stretch as well as extension. Went over ways to do at home for low load long duration stretches Then we really worked on normalizing her gait more natural bend at toe off and decreasing left  hip backward rotation, walked with her outside with focus on this Slant board stretch 2 step downs left foot staying on step, also tried 4  12/07/23 L knee PROM w/ end range holds   L knee patella mobs  Leg press 40lb 2x10, LLE 20lb 2x5  LAQ 3lb 2x10 HS curls green 2x10 Sit to stands holding yellow ball 6in step up s  12/04/23 NuStep L 5 x 6 min L knee PROM w/ end range holds   L knee patella mobs  Sit to stand 2x10 LAQ RLE 5lb 2x10 HS curls green 2x10 Heel raises 2x15 Leg press 40lb 2x10   PATIENT EDUCATION:  Education details: POC/HEP Person educated: Patient Education method: Explanation, Demonstration, Tactile cues, Verbal cues, and Handouts Education comprehension: verbalized understanding, returned demonstration, verbal cues required, tactile cues required, and needs further education  HOME EXERCISE PROGRAM: Access Code: [email or web address] URL: [email or web address] Date: 11/23/2023 Prepared by: Ozell Mainland  Exercises - Supine Quad Set  - 2 x daily - 7 x weekly - 2 sets - 10 reps - 3 hold - Supine Short Arc Quad  - 2 x daily - 7 x weekly - 2 sets - 10 reps - 3 hold - Supine Heel Slide  - 2 x daily - 7 x weekly - 1 sets - 10 reps - 10 hold - Seated Hamstring Stretch with Chair  - 2 x daily - 7 x weekly - 2 sets - 5 reps - 30 hold  ASSESSMENT:  CLINICAL IMPRESSION: PAtient has a gait where her knees remain bent both sides, she also has flexed hips, tried to see what was causing this she has very flexible HS, she was pretty good with hip flexors, the issues seem to be the top of the gastroc and the insertion of the HS this was where she felt the most and is still lacking the TKE, Had her try some extension mobs at home  Patient is a 53 y.o. female who was seen today for physical therapy treatment for s/p left TKA.  She had surgery 9/2, she is very guarded in the left quad, hip area.  She has very poor quad extension.  With ambulation she has the knee in  flexion with a forward flexed posture due to decrease TKE.   OBJECTIVE IMPAIRMENTS: Abnormal gait, cardiopulmonary status limiting activity, decreased activity tolerance, decreased balance, decreased coordination, decreased endurance, decreased mobility, difficulty walking, decreased ROM, decreased strength, increased edema, increased fascial restrictions, increased muscle spasms, impaired flexibility, improper body mechanics, postural dysfunction, and pain.   REHAB POTENTIAL: Good  CLINICAL DECISION MAKING: Evolving/moderate complexity  EVALUATION COMPLEXITY: Low  GOALS: Goals reviewed with patient? Yes  SHORT TERM GOALS: Target date: 12/14/23 Independent with initial HEP Baseline: Goal status: Met 11/27/23  LONG TERM GOALS: Target date: 02/22/24  Independent with advanced HEP Baseline:  Goal status: progressing 01/04/24  2.  Decrease pain overall 50% for higher quality of life Baseline:  Goal status: progressing 12/21/23 progressing 01/04/24 still some cramping  3.  Increase left knee AROM to 0-120 degrees flexion for increase functional gait and independence Baseline:  Goal status:progressing 110/16/25 10-106 degrees flexion  4.  Decrease TUG time to 11 seconds for functional gait Baseline:  Goal status: met 01/18/24  5.  Be able to go up and down stairs step over step without device Baseline:  Goal status:ongoing 12/15/23, able to do on 01/04/24 but still some difficulty  6.  Ambulate all distances without device with minimal deviations of gait Baseline:  Goal status: progressing 12/21/23, not using device, 01/04/24 not using device still has a slight bend in the knee with heel strike and has difficulty going down slopes   PLAN:  PT FREQUENCY: 2x/week  PT DURATION: 12 weeks  PLANNED INTERVENTIONS: 97164- PT Re-evaluation, 97110-Therapeutic exercises, 97530- Therapeutic activity, 97112- Neuromuscular re-education, 97535- Self Care, [id number]- Manual therapy, [phone]- Gait  training, [phone]- Vasopneumatic device, Patient/Family education, Balance training, Stair training, Joint mobilization, and Cryotherapy  PLAN FOR NEXT SESSION: push ROM and better gait, higher level activities 4 of 6 visits, will plan to maximize by the time she sees the MD on 02/06/24   OBADIAH OZELL ORN, PT,  01/22/2024, 4:21 PM

## 2024-01-25 ENCOUNTER — Ambulatory Visit: Admitting: Physical Therapy

## 2024-02-05 ENCOUNTER — Encounter: Payer: Self-pay | Admitting: Physical Therapy

## 2024-02-05 ENCOUNTER — Ambulatory Visit: Admitting: Physical Therapy

## 2024-02-05 DIAGNOSIS — M25562 Pain in left knee: Secondary | ICD-10-CM

## 2024-02-05 DIAGNOSIS — M25662 Stiffness of left knee, not elsewhere classified: Secondary | ICD-10-CM

## 2024-02-05 DIAGNOSIS — R600 Localized edema: Secondary | ICD-10-CM

## 2024-02-05 DIAGNOSIS — R262 Difficulty in walking, not elsewhere classified: Secondary | ICD-10-CM

## 2024-02-05 NOTE — Therapy (Signed)
 OUTPATIENT PHYSICAL THERAPY LOWER EXTREMITY TREATMENT   Patient Name: Tamara Bender MRN: [id number] DOB:1970/10/20, 53 y.o., female Today's Date: 02/05/2024  END OF SESSION:  PT End of Session - 02/05/24 1619     Visit Number 18    Date for Recertification  03/10/24    Authorization Type [employer] 5 of 6    PT Start Time 1618    PT Stop Time 1700    PT Time Calculation (min) 42 min    Activity Tolerance Patient tolerated treatment well    Behavior During Therapy WFL for tasks assessed/performed           Past Medical History:  Diagnosis Date   Allergy    Anxiety    Eczema    as a child,- 73 yo -- no current problems   Hypothyroid    PONV (postoperative nausea and vomiting)    Pre-diabetes    no meds - diet controlled   Past Surgical History:  Procedure Laterality Date   CESAREAN SECTION  2012, 2016   x 2   DILATION AND CURETTAGE OF UTERUS     x 2 - [facility]   DILATION AND CURETTAGE OF UTERUS     for 3rd miscarriage    DILATION AND EVACUATION N/A 04/18/2017   Procedure: DILATATION AND EVACUATION;  Surgeon: Alger Gong, MD;  Location: [facility];  Service: Gynecology;  Laterality: N/A;   KNEE CARTILAGE SURGERY Bilateral 1998, 2018   1998 left, 2018 right   LAPAROSCOPY  2013   blocked fallopian tube   WISDOM TOOTH EXTRACTION     Patient Active Problem List   Diagnosis Date Noted   Missed abortion    Mild persistent asthma without complication 01/05/2016   Other allergic rhinitis 01/05/2016   Acute non-recurrent maxillary sinusitis 01/05/2016    PCP: Andrew, MD  REFERRING PROVIDER: Melodi, MD  REFERRING DIAG: s/p left TKA  THERAPY DIAG:  Stiffness of left knee, not elsewhere classified  Localized edema  Difficulty in walking, not elsewhere classified  Acute pain of left knee  Rationale for Evaluation and Treatment: Rehabilitation  ONSET DATE: 11/21/23  SUBJECTIVE:   SUBJECTIVE STATEMENT: Doing pretty good, still stiff, and sore at times  Patient  reports that she has had issues since a lateral meniscus tear in 2018, she underwent a left TKA on 11/21/23.  She reports doing okay since surgery.  PERTINENT HISTORY: Pre diabetes PAIN:  Are you having pain? Yes: NPRS scale: 1/10 Pain location: left knee Pain description: ache Aggravating factors: getting up from sleep, standing and bending raising leg up, pain is up to 7/10 Relieving factors: pain meds, rest, ice , elevation at best a 2/10  PRECAUTIONS: None  RED FLAGS: None   WEIGHT BEARING RESTRICTIONS: No  FALLS:  Has patient fallen in last 6 months? No  LIVING ENVIRONMENT: Lives with: lives with their family Lives in: House/apartment Stairs: 3 steps to car Has following equipment at home: [name] - 2 wheeled  OCCUPATION: realtor. Stairs, walking  PLOF: Independent and yard work, house work, has a 56 and 51 year old  PATIENT GOALS: walk without any issues kick soccer ball, like to go on a hike. Ride a bike  NEXT MD VISIT: 12/05/23  OBJECTIVE:  Note: Objective measures were completed at Evaluation unless otherwise noted.  DIAGNOSTIC FINDINGS: ne  PATIENT SURVEYS:  LEFS = 8.8%  01/04/24 LEFS 39%  COGNITION: Overall cognitive status: Within functional limits for tasks assessed     SENSATION: WFL  EDEMA:  Circumferential: right mid patella 38 cm, 44 cm  left mid patella  MUSCLE LENGTH: Tight HS and calves  POSTURE: rounded shoulders and forward head  PALPATION: Mild tenderness to the left knee, the left quad is extremely tight, tightness in the ITB and the left buttock as well, she could not relax, worried about this being a significant habit that could cause hip/back issues  LOWER EXTREMITY ROM:  Active ROM AROM  LEft eval PROM Left eval AROM 11/27/23 AROM Left 12/07/23 A/PROM 12/21/23 A/PROM 01/04/24 AROM 01/11/24 AROM 01/18/24 AROM 02/05/24  Hip flexion           Hip extension           Hip abduction           Hip adduction           Hip internal  rotation           Hip external rotation           Knee flexion 90 96 92 92 102/106 106/110 111 115 118  Knee extension 60 28 19 13  12/7 10/0  10 7  Ankle dorsiflexion           Ankle plantarflexion           Ankle inversion           Ankle eversion            (Blank rows = not tested)  LOWER EXTREMITY MMT:  very poor quads, could not lift leg off the floor, pain in the patella   LOWER EXTREMITY SPECIAL TESTS:  Knee special tests: quad lag  FUNCTIONAL TESTS:  Timed up and go (TUG): 22 seconds with walker  01/04/24 TUG 12 seconds no device   GAIT: Distance walked: 100 feet Assistive device utilized: Environmental Consultant - 2 wheeled Level of assistance: SBA Comments: Patient does not straighten the leg, tends to not bear full weight on the left leg and uses her arms for most weight bearing, she does stick her bottom out and leans way forward, I feel that she is doing this due to pain and fear of quad activation, as noted above she could not lift the leg due to pain in the patella with quad activation                                                                                                                               TREATMENT DATE:  02/05/24 Nustep level 5 x 5 minutes LE's only Bike full revs x 4 minutes seat position 5 Stairs step over step up and down some hesitation Passive stretch to the left knee extension and flexion Feet on ball K2C, rotation, bridges SAQ 3#   01/22/24 Nustep level 5 x 6 minutes LE only Bike level 4 x 4 minutes LEg curls 25# Leg ext 5# Slant board stretch for calves Resisted walking  Leg press 20# Passive stretch flexion and extension  01/18/24 Bike eat position 4 and 5 full revs. Level 4 x 6 minutes Gait outside brisk pace, uneven terrain. LEg press 20# low Bosu reaching Leg curls 25# Leg ext 5# Passive stretch LE's really tried to figure out her gait with the stretches, she appears to have tight hip flexors but I think it is calves and  HS  01/15/24 Leg curls 25# 2x10 Leg ext 5# 2x10 Nustep level 5 LE only x 5 minutes Leg press 20# going as low as possible and then with no weight Bike level 4 full revs x 5 minutes Slant board calf stretch Feet on ball K2C, rotation, bridge, isometric abs PAssive stretch left knee flexion and extension   01/11/24 Bike full revs without any trunk lean x 6 minutes Calf stretches LEg press 20# various angles, then no weight as low as she can go Leg curls 20# 2x10 Leg extension 5# 2x10 Gait outside steep slopes up and down, uneven terrain Passive stretch  01/04/24 Bike partial revs working to full revolutions with some trunk lean.   Gait outside working on form, step length and then stairs step over step with handrails PROM of the left knee flexion and extension STM to the left calf and HS, scar and patellar mobilizations Calf stretches LEg press working on going low to gain flexion TUG as noted above 12 seconds without device and did LEFS with her  SAQ 4# with cues for TKE Sit to stand without hands working on no trunk lean  01/01/24 Bike partial revs x 6 minutes, tried some full revs with trunk lean Outside gait working on speed and form, up and down stairs step over step with CGA and handrail HS curls 2x10 25# LEg press 20# at level 3 and 4 with one hand width from toe to the top of foot plate, then same with no weight down to the bottom SAQ 5# 2x10 Feet on ball K2C, bridges, isometric abs Passive stretch into flexion and extension  12/28/23 Nustep level 4 LE only x 4 minutes Bike partial revs 4 minutes LEg press 20# working on flexion 40# resisted gait all directions 25# HS curls 5# leg extension Tmill pushes STM, scar mob, joint mobs  Passive stretch flexion and extension  12/25/23 Nustep level 5 x 6 minutes Gait outside around the parking [email or web address] good pace working on posture with this with some verbal and tactile cues Bike partial revs x 4 minutes LEg curls 15#  2x10  Airex marching Left foot on 6 step and doing step downs  6 step up fwd and side Leg press 20# seat position #4 and #3 40# resisted gait all directions T mill pushes fwd and backward Passive stretch into flexion and extension  12/21/23 Gait outside, practice curbs, HHA and then some stairs trying to step down and go up stressing the surgical knee Nustep level 5 x 5 minutes Worked on gait mechanics with natural bend, less hip motions Leg press various angles working on flexion, toes one hand width from the top and going down to position 3 Passive stretch flexion and extension Lunge step stretch Scar mobs, joint mobs and joint distraction Seated self PROM knee flexion stretch ROM measured as noted above  12/18/23: Seated for L knee flexion stretch, supine for patellar mobs, deep cross friction massage distal L quads, retrograde massage med and lat knee jt line Supine for distraction L lower leg, combined with quad sets to improve L knee extension Rom Prone for TKE's with bolster under ankles  Prone for L knee flexion stretch, contract/ relax technique end range Supine for L ant hip quads stretch L LE off table for contract/ relax  Standing B heel drops , 15 sec holds, with heel raises Lunges on step L LE to improve L knee flexion ROM Kinesiotaping L ant knee, 2 I pieces, surrounding patella to improve positioning of patella and reduce soft tissue restrictions distal L quads  12/15/23 Passive stretches with Tgun, some LLLD, some contract relax, did both flexion and extension Joint distraction and mobilization Feet on ball K2C, rotation, bridge, isometric abs Ball b/n knees squeeze with bridge SAQ 2.5# 3 x10 Prone knee flexion stretch Leg press no weight working on flexion  down to position #4 Gait with cues for heel strike and a natural bend with toe off, tends to rotate left hip posteriorly  PATIENT EDUCATION:  Education details: POC/HEP Person educated: Patient Education  method: Programmer, Multimedia, Facilities Manager, Actor cues, Verbal cues, and Handouts Education comprehension: verbalized understanding, returned demonstration, verbal cues required, tactile cues required, and needs further education  HOME EXERCISE PROGRAM: Access Code: [email or web address] URL: [email or web address] Date: 11/23/2023 Prepared by: Ozell Mainland  Exercises - Supine Quad Set  - 2 x daily - 7 x weekly - 2 sets - 10 reps - 3 hold - Supine Short Arc Quad  - 2 x daily - 7 x weekly - 2 sets - 10 reps - 3 hold - Supine Heel Slide  - 2 x daily - 7 x weekly - 1 sets - 10 reps - 10 hold - Seated Hamstring Stretch with Chair  - 2 x daily - 7 x weekly - 2 sets - 5 reps - 30 hold  ASSESSMENT:  CLINICAL IMPRESSION: PAtient has a gait where her knees remain bent both sides, she also has flexed hips, she overall is doing very well, much less pain.  She tolerates the passive stretch with some pain, I can get full knee extension and to 120 degrees flexion passively.  She is able to do SAQ very well with full ROM, but in sitting and with walking has the limitation of lacking TKE,   Patient is a 53 y.o. female who was seen today for physical therapy treatment for s/p left TKA.  She had surgery 9/2, she is very guarded in the left quad, hip area.  She has very poor quad extension.  With ambulation she has the knee in flexion with a forward flexed posture due to decrease TKE.   OBJECTIVE IMPAIRMENTS: Abnormal gait, cardiopulmonary status limiting activity, decreased activity tolerance, decreased balance, decreased coordination, decreased endurance, decreased mobility, difficulty walking, decreased ROM, decreased strength, increased edema, increased fascial restrictions, increased muscle spasms, impaired flexibility, improper body mechanics, postural dysfunction, and pain.   REHAB POTENTIAL: Good  CLINICAL DECISION MAKING: Evolving/moderate complexity  EVALUATION COMPLEXITY: Low   GOALS: Goals  reviewed with patient? Yes  SHORT TERM GOALS: Target date: 12/14/23 Independent with initial HEP Baseline: Goal status: Met 11/27/23  LONG TERM GOALS: Target date: 02/22/24  Independent with advanced HEP Baseline:  Goal status: progressing 01/04/24  2.  Decrease pain overall 50% for higher quality of life Baseline:  Goal status: progressing 12/21/23 progressing 01/04/24 still some cramping met 02/05/24  3.  Increase left knee AROM to 0-120 degrees flexion for increase functional gait and independence Baseline:  Goal status:progressing 110/16/25 10-106 degrees flexion  continuing to progress 7-115 degrees 02/05/24  4.  Decrease TUG time to 11 seconds for functional gait Baseline:  Goal status: met 01/18/24  5.  Be able to go up and down stairs step over step without device Baseline:  Goal status:ongoing 12/15/23, able to do on 01/04/24 but still some difficulty still a hesitation to start 02/05/24  6.  Ambulate all distances without device with minimal deviations of gait Baseline:  Goal status: progressing 12/21/23, not using device, 01/04/24 not using device still has a slight bend in the knee with heel strike and has difficulty going down slopes, 02/05/24 still with bent hips and knees   PLAN:  PT FREQUENCY: 2x/week  PT DURATION: 12 weeks  PLANNED INTERVENTIONS: 97164- PT Re-evaluation, 97110-Therapeutic exercises, 97530- Therapeutic activity, 97112- Neuromuscular re-education, 97535- Self Care, [id number]- Manual therapy, [phone]- Gait training, [phone]- Vasopneumatic device, Patient/Family education, Balance training, Stair training, Joint mobilization, and Cryotherapy  PLAN FOR NEXT SESSION: push ROM and better gait, higher level activities 5 of 6 visits, will plan to maximize by the time she sees the MD on 02/06/24  possible D/C next visit   Infiniti Hoefling W, PT,  02/05/2024, 4:20 PM

## 2024-02-09 ENCOUNTER — Other Ambulatory Visit (HOSPITAL_COMMUNITY): Payer: Self-pay

## 2024-02-09 MED ORDER — MOUNJARO 15 MG/0.5ML ~~LOC~~ SOAJ
15.0000 mg | SUBCUTANEOUS | 5 refills | Status: AC
  Filled 2024-02-09: qty 2, 28d supply, fill #0
  Filled 2024-03-06: qty 2, 28d supply, fill #1

## 2024-02-20 ENCOUNTER — Encounter: Payer: Self-pay | Admitting: Physical Therapy

## 2024-02-20 ENCOUNTER — Ambulatory Visit: Attending: Orthopedic Surgery | Admitting: Physical Therapy

## 2024-02-20 DIAGNOSIS — M25662 Stiffness of left knee, not elsewhere classified: Secondary | ICD-10-CM | POA: Diagnosis present

## 2024-02-20 DIAGNOSIS — M25562 Pain in left knee: Secondary | ICD-10-CM | POA: Diagnosis present

## 2024-02-20 DIAGNOSIS — R600 Localized edema: Secondary | ICD-10-CM | POA: Diagnosis present

## 2024-02-20 DIAGNOSIS — R262 Difficulty in walking, not elsewhere classified: Secondary | ICD-10-CM | POA: Insufficient documentation

## 2024-02-20 NOTE — Therapy (Signed)
 OUTPATIENT PHYSICAL THERAPY LOWER EXTREMITY TREATMENT   Patient Name: Tamara Bender MRN: [id number] DOB:August 29, 1970, 53 y.o., female Today's Date: 02/20/2024  END OF SESSION:  PT End of Session - 02/20/24 0953     Visit Number 19    Date for Recertification  03/10/24    Authorization Type [employer] 6 of 6    PT Start Time 0930    PT Stop Time 1010    PT Time Calculation (min) 40 min    Activity Tolerance Patient tolerated treatment well    Behavior During Therapy WFL for tasks assessed/performed           Past Medical History:  Diagnosis Date   Allergy    Anxiety    Eczema    as a child,- 50 yo -- no current problems   Hypothyroid    PONV (postoperative nausea and vomiting)    Pre-diabetes    no meds - diet controlled   Past Surgical History:  Procedure Laterality Date   CESAREAN SECTION  2012, 2016   x 2   DILATION AND CURETTAGE OF UTERUS     x 2 - [facility]   DILATION AND CURETTAGE OF UTERUS     for 3rd miscarriage    DILATION AND EVACUATION N/A 04/18/2017   Procedure: DILATATION AND EVACUATION;  Surgeon: Alger Gong, MD;  Location: [facility];  Service: Gynecology;  Laterality: N/A;   KNEE CARTILAGE SURGERY Bilateral 1998, 2018   1998 left, 2018 right   LAPAROSCOPY  2013   blocked fallopian tube   WISDOM TOOTH EXTRACTION     Patient Active Problem List   Diagnosis Date Noted   Missed abortion    Mild persistent asthma without complication 01/05/2016   Other allergic rhinitis 01/05/2016   Acute non-recurrent maxillary sinusitis 01/05/2016    PCP: Andrew, MD  REFERRING PROVIDER: Melodi, MD  REFERRING DIAG: s/p left TKA  THERAPY DIAG:  Stiffness of left knee, not elsewhere classified  Localized edema  Difficulty in walking, not elsewhere classified  Acute pain of left knee  Rationale for Evaluation and Treatment: Rehabilitation  ONSET DATE: 11/21/23  SUBJECTIVE:   SUBJECTIVE STATEMENT: Doing pretty good, still stiff, and sore at times, saw the MD  he was pleased, feels like we are good to D/C  Patient reports that she has had issues since a lateral meniscus tear in 2018, she underwent a left TKA on 11/21/23.  She reports doing okay since surgery.  PERTINENT HISTORY: Pre diabetes PAIN:  Are you having pain? Yes: NPRS scale: 1/10 Pain location: left knee Pain description: ache Aggravating factors: getting up from sleep, standing and bending raising leg up, pain is up to 7/10 Relieving factors: pain meds, rest, ice , elevation at best a 2/10  PRECAUTIONS: None  RED FLAGS: None   WEIGHT BEARING RESTRICTIONS: No  FALLS:  Has patient fallen in last 6 months? No  LIVING ENVIRONMENT: Lives with: lives with their family Lives in: House/apartment Stairs: 3 steps to car Has following equipment at home: [name] - 2 wheeled  OCCUPATION: realtor. Stairs, walking  PLOF: Independent and yard work, house work, has a 13 and 51 year old  PATIENT GOALS: walk without any issues kick soccer ball, like to go on a hike. Ride a bike  NEXT MD VISIT: 12/05/23  OBJECTIVE:  Note: Objective measures were completed at Evaluation unless otherwise noted.  DIAGNOSTIC FINDINGS: ne  PATIENT SURVEYS:  LEFS = 8.8%  01/04/24 LEFS 39%  COGNITION: Overall cognitive status: Within functional  limits for tasks assessed     SENSATION: WFL  EDEMA:  Circumferential: right mid patella 38 cm, 44 cm  left mid patella  MUSCLE LENGTH: Tight HS and calves  POSTURE: rounded shoulders and forward head  PALPATION: Mild tenderness to the left knee, the left quad is extremely tight, tightness in the ITB and the left buttock as well, she could not relax, worried about this being a significant habit that could cause hip/back issues  LOWER EXTREMITY ROM:  Active ROM AROM  LEft eval PROM Left eval AROM 11/27/23 AROM Left 12/07/23 A/PROM 12/21/23 A/PROM 01/04/24 AROM 01/11/24 AROM 01/18/24 AROM 02/05/24  Hip flexion           Hip extension           Hip  abduction           Hip adduction           Hip internal rotation           Hip external rotation           Knee flexion 90 96 92 92 102/106 106/110 111 115 118  Knee extension 60 28 19 13  12/7 10/0  10 7  Ankle dorsiflexion           Ankle plantarflexion           Ankle inversion           Ankle eversion            (Blank rows = not tested)  LOWER EXTREMITY MMT:  very poor quads, could not lift leg off the floor, pain in the patella   LOWER EXTREMITY SPECIAL TESTS:  Knee special tests: quad lag  FUNCTIONAL TESTS:  Timed up and go (TUG): 22 seconds with walker  01/04/24 TUG 12 seconds no device   GAIT: Distance walked: 100 feet Assistive device utilized: Environmental Consultant - 2 wheeled Level of assistance: SBA Comments: Patient does not straighten the leg, tends to not bear full weight on the left leg and uses her arms for most weight bearing, she does stick her bottom out and leans way forward, I feel that she is doing this due to pain and fear of quad activation, as noted above she could not lift the leg due to pain in the patella with quad activation                                                                                                                               TREATMENT DATE:  02/20/24 Review of HEP, outcomes, expectations, how to maintain and push ROM and her walking and functional activities safely Passive stretch of the left LE flexion and extension.   STM to the left quad and ITB Patellar and scar mobs Gait  02/05/24 Nustep level 5 x 5 minutes LE's only Bike full revs x 4 minutes seat position 5 Stairs step over step up and down some hesitation Passive stretch  to the left knee extension and flexion Feet on ball K2C, rotation, bridges SAQ 3#   01/22/24 Nustep level 5 x 6 minutes LE only Bike level 4 x 4 minutes LEg curls 25# Leg ext 5# Slant board stretch for calves Resisted walking  Leg press 20# Passive stretch flexion and extension   01/18/24 Bike eat  position 4 and 5 full revs. Level 4 x 6 minutes Gait outside brisk pace, uneven terrain. LEg press 20# low Bosu reaching Leg curls 25# Leg ext 5# Passive stretch LE's really tried to figure out her gait with the stretches, she appears to have tight hip flexors but I think it is calves and HS  01/15/24 Leg curls 25# 2x10 Leg ext 5# 2x10 Nustep level 5 LE only x 5 minutes Leg press 20# going as low as possible and then with no weight Bike level 4 full revs x 5 minutes Slant board calf stretch Feet on ball K2C, rotation, bridge, isometric abs PAssive stretch left knee flexion and extension   01/11/24 Bike full revs without any trunk lean x 6 minutes Calf stretches LEg press 20# various angles, then no weight as low as she can go Leg curls 20# 2x10 Leg extension 5# 2x10 Gait outside steep slopes up and down, uneven terrain Passive stretch  01/04/24 Bike partial revs working to full revolutions with some trunk lean.   Gait outside working on form, step length and then stairs step over step with handrails PROM of the left knee flexion and extension STM to the left calf and HS, scar and patellar mobilizations Calf stretches LEg press working on going low to gain flexion TUG as noted above 12 seconds without device and did LEFS with her  SAQ 4# with cues for TKE Sit to stand without hands working on no trunk lean  01/01/24 Bike partial revs x 6 minutes, tried some full revs with trunk lean Outside gait working on speed and form, up and down stairs step over step with CGA and handrail HS curls 2x10 25# LEg press 20# at level 3 and 4 with one hand width from toe to the top of foot plate, then same with no weight down to the bottom SAQ 5# 2x10 Feet on ball K2C, bridges, isometric abs Passive stretch into flexion and extension  12/28/23 Nustep level 4 LE only x 4 minutes Bike partial revs 4 minutes LEg press 20# working on flexion 40# resisted gait all directions 25# HS  curls 5# leg extension Tmill pushes STM, scar mob, joint mobs  Passive stretch flexion and extension  12/25/23 Nustep level 5 x 6 minutes Gait outside around the parking [email or web address] good pace working on posture with this with some verbal and tactile cues Bike partial revs x 4 minutes LEg curls 15# 2x10  Airex marching Left foot on 6 step and doing step downs  6 step up fwd and side Leg press 20# seat position #4 and #3 40# resisted gait all directions T mill pushes fwd and backward Passive stretch into flexion and extension  12/21/23 Gait outside, practice curbs, HHA and then some stairs trying to step down and go up stressing the surgical knee Nustep level 5 x 5 minutes Worked on gait mechanics with natural bend, less hip motions Leg press various angles working on flexion, toes one hand width from the top and going down to position 3 Passive stretch flexion and extension Lunge step stretch Scar mobs, joint mobs and joint distraction Seated self PROM knee flexion  stretch ROM measured as noted above  12/18/23: Seated for L knee flexion stretch, supine for patellar mobs, deep cross friction massage distal L quads, retrograde massage med and lat knee jt line Supine for distraction L lower leg, combined with quad sets to improve L knee extension Rom Prone for TKE's with bolster under ankles  Prone for L knee flexion stretch, contract/ relax technique end range Supine for L ant hip quads stretch L LE off table for contract/ relax  Standing B heel drops , 15 sec holds, with heel raises Lunges on step L LE to improve L knee flexion ROM Kinesiotaping L ant knee, 2 I pieces, surrounding patella to improve positioning of patella and reduce soft tissue restrictions distal L quads  12/15/23 Passive stretches with Tgun, some LLLD, some contract relax, did both flexion and extension Joint distraction and mobilization Feet on ball K2C, rotation, bridge, isometric abs Ball b/n knees squeeze  with bridge SAQ 2.5# 3 x10 Prone knee flexion stretch Leg press no weight working on flexion  down to position #4 Gait with cues for heel strike and a natural bend with toe off, tends to rotate left hip posteriorly  PATIENT EDUCATION:  Education details: POC/HEP Person educated: Patient Education method: Programmer, Multimedia, Facilities Manager, Actor cues, Verbal cues, and Handouts Education comprehension: verbalized understanding, returned demonstration, verbal cues required, tactile cues required, and needs further education  HOME EXERCISE PROGRAM: Access Code: [email or web address] URL: [email or web address] Date: 11/23/2023 Prepared by: Ozell Mainland  Exercises - Supine Quad Set  - 2 x daily - 7 x weekly - 2 sets - 10 reps - 3 hold - Supine Short Arc Quad  - 2 x daily - 7 x weekly - 2 sets - 10 reps - 3 hold - Supine Heel Slide  - 2 x daily - 7 x weekly - 1 sets - 10 reps - 10 hold - Seated Hamstring Stretch with Chair  - 2 x daily - 7 x weekly - 2 sets - 5 reps - 30 hold  ASSESSMENT:  CLINICAL IMPRESSION: Patient doing well, AROM 4-118 flexion, Passive 0-124 degrees.  She c/o some tenderness in the superior scar, she was tight in the ITB.  We went over all the HEP and how to maintain and progress the ROM and her overall function.  Her walking is better with less bend in the knee  Patient is a 53 y.o. female who was seen today for physical therapy treatment for s/p left TKA.  She had surgery 9/2, she is very guarded in the left quad, hip area.  She has very poor quad extension.  With ambulation she has the knee in flexion with a forward flexed posture due to decrease TKE.   OBJECTIVE IMPAIRMENTS: Abnormal gait, cardiopulmonary status limiting activity, decreased activity tolerance, decreased balance, decreased coordination, decreased endurance, decreased mobility, difficulty walking, decreased ROM, decreased strength, increased edema, increased fascial restrictions, increased muscle  spasms, impaired flexibility, improper body mechanics, postural dysfunction, and pain.   REHAB POTENTIAL: Good  CLINICAL DECISION MAKING: Evolving/moderate complexity  EVALUATION COMPLEXITY: Low   GOALS: Goals reviewed with patient? Yes  SHORT TERM GOALS: Target date: 12/14/23 Independent with initial HEP Baseline: Goal status: Met 11/27/23  LONG TERM GOALS: Target date: 02/22/24  Independent with advanced HEP Baseline:  Goal status: met 02/20/24  2.  Decrease pain overall 50% for higher quality of life Baseline:  Goal status: progressing 12/21/23 progressing 01/04/24 still some cramping met 02/05/24 met 02/20/24  3.  Increase left knee AROM to  0-120 degrees flexion for increase functional gait and independence Baseline:  Goal status:progressing 110/16/25 10-106 degrees flexion  continuing to progress 7-115 degrees 02/05/24, met 02/20/24  4.  Decrease TUG time to 11 seconds for functional gait Baseline:  Goal status: met 01/18/24  5.  Be able to go up and down stairs step over step without device Baseline:  Goal status:ongoing 12/15/23, able to do on 01/04/24 but still some difficulty still a hesitation to start 02/05/24, met 02/20/24  6.  Ambulate all distances without device with minimal deviations of gait Baseline:  Goal status: progressing 12/21/23, not using device, 01/04/24 not using device still has a slight bend in the knee with heel strike and has difficulty going down slopes, 02/05/24 still with bent hips and knees, met 02/20/24   PLAN:  PT FREQUENCY: 2x/week  PT DURATION: 12 weeks  PLANNED INTERVENTIONS: 97164- PT Re-evaluation, 97110-Therapeutic exercises, 97530- Therapeutic activity, 97112- Neuromuscular re-education, 97535- Self Care, [id number]- Manual therapy, [phone]- Gait training, [phone]- Vasopneumatic device, Patient/Family education, Balance training, Stair training, Joint mobilization, and Cryotherapy  PLAN FOR NEXT SESSION: D/C goals met   OBADIAH OZELL ORN,  PT,  02/20/2024, 9:54 AM

## 2024-03-06 ENCOUNTER — Other Ambulatory Visit (HOSPITAL_COMMUNITY): Payer: Self-pay

## 2024-04-01 ENCOUNTER — Other Ambulatory Visit (HOSPITAL_COMMUNITY): Payer: Self-pay

## 2024-04-01 ENCOUNTER — Other Ambulatory Visit: Payer: Self-pay

## 2024-04-09 ENCOUNTER — Other Ambulatory Visit (HOSPITAL_COMMUNITY): Payer: Self-pay

## 2024-04-10 ENCOUNTER — Other Ambulatory Visit (HOSPITAL_COMMUNITY): Payer: Self-pay
